# Patient Record
Sex: Female | Born: 1940 | Race: White | Hispanic: No | Smoking: Never smoker
Health system: Southern US, Community
[De-identification: ages and names within clinical notes are randomized; demographics above are authoritative.]

## PROBLEM LIST (undated history)

## (undated) DIAGNOSIS — E785 Hyperlipidemia, unspecified: Secondary | ICD-10-CM

## (undated) DIAGNOSIS — K649 Unspecified hemorrhoids: Secondary | ICD-10-CM

## (undated) DIAGNOSIS — I10 Essential (primary) hypertension: Secondary | ICD-10-CM

## (undated) HISTORY — DX: Essential (primary) hypertension: I10

## (undated) HISTORY — DX: Unspecified hemorrhoids: K64.9

## (undated) HISTORY — PX: TONSILLECTOMY AND ADENOIDECTOMY: SUR1326

## (undated) HISTORY — DX: Hyperlipidemia, unspecified: E78.5

## (undated) HISTORY — PX: ABDOMINAL HYSTERECTOMY: SHX81

---

## 2002-02-09 ENCOUNTER — Encounter: Payer: Self-pay | Admitting: Family Medicine

## 2002-02-09 ENCOUNTER — Ambulatory Visit (HOSPITAL_COMMUNITY): Admission: RE | Admit: 2002-02-09 | Discharge: 2002-02-09 | Payer: Self-pay | Admitting: Family Medicine

## 2003-02-16 ENCOUNTER — Ambulatory Visit (HOSPITAL_COMMUNITY): Admission: RE | Admit: 2003-02-16 | Discharge: 2003-02-16 | Payer: Self-pay | Admitting: Family Medicine

## 2003-02-16 ENCOUNTER — Encounter: Payer: Self-pay | Admitting: Family Medicine

## 2003-03-15 ENCOUNTER — Encounter: Payer: Self-pay | Admitting: Family Medicine

## 2003-03-15 ENCOUNTER — Ambulatory Visit (HOSPITAL_COMMUNITY): Admission: RE | Admit: 2003-03-15 | Discharge: 2003-03-15 | Payer: Self-pay | Admitting: Family Medicine

## 2004-04-10 ENCOUNTER — Ambulatory Visit (HOSPITAL_COMMUNITY): Admission: RE | Admit: 2004-04-10 | Discharge: 2004-04-10 | Payer: Self-pay | Admitting: Family Medicine

## 2005-04-17 ENCOUNTER — Ambulatory Visit (HOSPITAL_COMMUNITY): Admission: RE | Admit: 2005-04-17 | Discharge: 2005-04-17 | Payer: Self-pay | Admitting: Family Medicine

## 2005-05-13 ENCOUNTER — Ambulatory Visit (HOSPITAL_COMMUNITY): Admission: RE | Admit: 2005-05-13 | Discharge: 2005-05-13 | Payer: Self-pay | Admitting: Family Medicine

## 2006-04-19 ENCOUNTER — Ambulatory Visit (HOSPITAL_COMMUNITY): Admission: RE | Admit: 2006-04-19 | Discharge: 2006-04-19 | Payer: Self-pay | Admitting: Internal Medicine

## 2007-05-09 ENCOUNTER — Ambulatory Visit (HOSPITAL_COMMUNITY): Admission: RE | Admit: 2007-05-09 | Discharge: 2007-05-09 | Payer: Self-pay | Admitting: Family Medicine

## 2008-05-09 ENCOUNTER — Ambulatory Visit (HOSPITAL_COMMUNITY): Admission: RE | Admit: 2008-05-09 | Discharge: 2008-05-09 | Payer: Self-pay | Admitting: Family Medicine

## 2009-10-22 ENCOUNTER — Ambulatory Visit (HOSPITAL_COMMUNITY): Admission: RE | Admit: 2009-10-22 | Discharge: 2009-10-22 | Payer: Self-pay | Admitting: Family Medicine

## 2010-06-29 ENCOUNTER — Encounter: Payer: Self-pay | Admitting: Family Medicine

## 2010-11-24 ENCOUNTER — Other Ambulatory Visit (HOSPITAL_COMMUNITY): Payer: Self-pay | Admitting: Physician Assistant

## 2010-11-24 DIAGNOSIS — Z139 Encounter for screening, unspecified: Secondary | ICD-10-CM

## 2010-11-25 ENCOUNTER — Ambulatory Visit (HOSPITAL_COMMUNITY)
Admission: RE | Admit: 2010-11-25 | Discharge: 2010-11-25 | Disposition: A | Discharge status: Home / Self Care | Payer: Medicare Other | Source: Ambulatory Visit | Attending: Physician Assistant | Admitting: Physician Assistant

## 2010-11-25 DIAGNOSIS — Z139 Encounter for screening, unspecified: Secondary | ICD-10-CM

## 2010-11-25 DIAGNOSIS — Z1231 Encounter for screening mammogram for malignant neoplasm of breast: Secondary | ICD-10-CM | POA: Insufficient documentation

## 2012-03-04 DIAGNOSIS — Z23 Encounter for immunization: Secondary | ICD-10-CM | POA: Diagnosis not present

## 2012-03-07 ENCOUNTER — Other Ambulatory Visit (HOSPITAL_COMMUNITY): Payer: Self-pay | Admitting: Family Medicine

## 2012-03-07 DIAGNOSIS — Z139 Encounter for screening, unspecified: Secondary | ICD-10-CM

## 2012-03-08 ENCOUNTER — Ambulatory Visit (HOSPITAL_COMMUNITY)
Admission: RE | Admit: 2012-03-08 | Discharge: 2012-03-08 | Disposition: A | Discharge status: Home / Self Care | Payer: Medicare Other | Source: Ambulatory Visit | Attending: Family Medicine | Admitting: Family Medicine

## 2012-03-08 DIAGNOSIS — Z1231 Encounter for screening mammogram for malignant neoplasm of breast: Secondary | ICD-10-CM | POA: Diagnosis not present

## 2012-03-08 DIAGNOSIS — Z139 Encounter for screening, unspecified: Secondary | ICD-10-CM

## 2012-03-23 DIAGNOSIS — E785 Hyperlipidemia, unspecified: Secondary | ICD-10-CM | POA: Diagnosis not present

## 2012-03-23 DIAGNOSIS — Z7182 Exercise counseling: Secondary | ICD-10-CM | POA: Diagnosis not present

## 2012-03-23 DIAGNOSIS — Z23 Encounter for immunization: Secondary | ICD-10-CM | POA: Diagnosis not present

## 2012-03-23 DIAGNOSIS — R7301 Impaired fasting glucose: Secondary | ICD-10-CM | POA: Diagnosis not present

## 2012-03-23 DIAGNOSIS — I10 Essential (primary) hypertension: Secondary | ICD-10-CM | POA: Diagnosis not present

## 2012-03-23 DIAGNOSIS — Z713 Dietary counseling and surveillance: Secondary | ICD-10-CM | POA: Diagnosis not present

## 2012-03-23 DIAGNOSIS — Z6833 Body mass index (BMI) 33.0-33.9, adult: Secondary | ICD-10-CM | POA: Diagnosis not present

## 2013-03-27 ENCOUNTER — Other Ambulatory Visit (HOSPITAL_COMMUNITY): Payer: Self-pay | Admitting: Family Medicine

## 2013-03-27 DIAGNOSIS — Z139 Encounter for screening, unspecified: Secondary | ICD-10-CM

## 2013-03-30 ENCOUNTER — Ambulatory Visit (HOSPITAL_COMMUNITY)
Admission: RE | Admit: 2013-03-30 | Discharge: 2013-03-30 | Disposition: A | Discharge status: Home / Self Care | Payer: Medicare Other | Source: Ambulatory Visit | Attending: Family Medicine | Admitting: Family Medicine

## 2013-03-30 DIAGNOSIS — Z1231 Encounter for screening mammogram for malignant neoplasm of breast: Secondary | ICD-10-CM | POA: Insufficient documentation

## 2013-03-30 DIAGNOSIS — Z139 Encounter for screening, unspecified: Secondary | ICD-10-CM

## 2013-04-05 DIAGNOSIS — E785 Hyperlipidemia, unspecified: Secondary | ICD-10-CM | POA: Diagnosis not present

## 2013-04-05 DIAGNOSIS — I10 Essential (primary) hypertension: Secondary | ICD-10-CM | POA: Diagnosis not present

## 2013-04-05 DIAGNOSIS — Z6833 Body mass index (BMI) 33.0-33.9, adult: Secondary | ICD-10-CM | POA: Diagnosis not present

## 2013-04-05 DIAGNOSIS — R7301 Impaired fasting glucose: Secondary | ICD-10-CM | POA: Diagnosis not present

## 2013-04-06 DIAGNOSIS — Z23 Encounter for immunization: Secondary | ICD-10-CM | POA: Diagnosis not present

## 2013-11-27 ENCOUNTER — Encounter: Payer: Self-pay | Admitting: Gastroenterology

## 2013-11-27 DIAGNOSIS — Z6832 Body mass index (BMI) 32.0-32.9, adult: Secondary | ICD-10-CM | POA: Diagnosis not present

## 2013-11-27 DIAGNOSIS — K921 Melena: Secondary | ICD-10-CM | POA: Diagnosis not present

## 2013-12-01 NOTE — Telephone Encounter (Signed)
error 

## 2014-02-06 ENCOUNTER — Encounter: Payer: Self-pay | Admitting: Gastroenterology

## 2014-02-06 ENCOUNTER — Ambulatory Visit (INDEPENDENT_AMBULATORY_CARE_PROVIDER_SITE_OTHER): Payer: Medicare Other | Admitting: Gastroenterology

## 2014-02-06 VITALS — BP 146/90 | HR 80 | Ht 63.0 in | Wt 187.6 lb

## 2014-02-06 DIAGNOSIS — K921 Melena: Secondary | ICD-10-CM | POA: Diagnosis not present

## 2014-02-06 MED ORDER — PEG-KCL-NACL-NASULF-NA ASC-C 100 G PO SOLR: 1.0000 | Freq: Once | ORAL | Status: DC

## 2014-02-06 NOTE — Progress Notes (Signed)
    History of Present Illness: This is a 73 year old female accompanied by her daughter. The patient relates one day of watery diarrhea small amount of red blood that occurred in June. She saw her primary physician and was prescribed Anusol suppositories for presumed hemorrhoids. She states she has occasional mild constipation for many years and this has not changed. She has no other gastrointestinal complaints. She is not previously undergone colonoscopy despite recommendations by her primary physician Dr. Hilma Favors. Denies weight loss, abdominal pain, diarrhea, change in stool caliber, melena, nausea, vomiting, dysphagia, reflux symptoms, chest pain.  Review of Systems: Pertinent positive and negative review of systems were noted in the above HPI section. All other review of systems were otherwise negative.  Current Medications, Allergies, Past Medical History, Past Surgical History, Family History and Social History were reviewed in [employer] record.  Physical Exam: General: Well developed , well nourished, no acute distress Head: Normocephalic and atraumatic Eyes:  sclerae anicteric, EOMI Ears: Normal auditory acuity Mouth: No deformity or lesions Neck: Supple, no masses or thyromegaly Lungs: Clear throughout to auscultation Heart: Regular rate and rhythm; no murmurs, rubs or bruits Abdomen: Soft, non tender and non distended. No masses, hepatosplenomegaly or hernias noted. Normal Bowel sounds Rectal: deferred to colonoscopy   Musculoskeletal: Symmetrical with no gross deformities  Skin: No lesions on visible extremities Pulses:  Normal pulses noted Extremities: No clubbing, cyanosis, edema or deformities noted Neurological: Alert oriented x 4, grossly nonfocal Cervical Nodes:  No significant cervical adenopathy Inguinal Nodes: No significant inguinal adenopathy Psychological:  Alert and cooperative. Normal mood and affect  Assessment and Recommendations:  1.  Hematochezia, in setting of a self-limited diarrheal illness.  Rule out hemorrhoids, colorectal neoplasms. Schedule colonoscopy. The risks, benefits, and alternatives to colonoscopy with possible biopsy and possible polypectomy were discussed with the patient and they consent to proceed.

## 2014-02-06 NOTE — Patient Instructions (Signed)
You have been scheduled for a colonoscopy. Please follow written instructions given to you at your visit today.  Please pick up your prep kit at the pharmacy within the next 1-3 days. If you use inhalers (even only as needed), please bring them with you on the day of your procedure.  Thank you for choosing me and [facility].  Pricilla Riffle. Dagoberto Ligas., MD., Marval Regal  cc: Sharilyn Sites, MD

## 2014-02-19 ENCOUNTER — Ambulatory Visit (AMBULATORY_SURGERY_CENTER): Payer: Medicare Other | Admitting: Gastroenterology

## 2014-02-19 ENCOUNTER — Encounter: Payer: Self-pay | Admitting: Gastroenterology

## 2014-02-19 VITALS — BP 143/77 | HR 75 | Temp 98.5°F | Resp 29 | Ht 63.0 in | Wt 187.0 lb

## 2014-02-19 DIAGNOSIS — D126 Benign neoplasm of colon, unspecified: Secondary | ICD-10-CM

## 2014-02-19 DIAGNOSIS — E669 Obesity, unspecified: Secondary | ICD-10-CM | POA: Diagnosis not present

## 2014-02-19 DIAGNOSIS — K921 Melena: Secondary | ICD-10-CM | POA: Diagnosis not present

## 2014-02-19 DIAGNOSIS — D120 Benign neoplasm of cecum: Secondary | ICD-10-CM

## 2014-02-19 DIAGNOSIS — I10 Essential (primary) hypertension: Secondary | ICD-10-CM | POA: Diagnosis not present

## 2014-02-19 MED ORDER — SODIUM CHLORIDE 0.9 % IV SOLN: 500.0000 mL | INTRAVENOUS | Status: DC

## 2014-02-19 NOTE — Op Note (Signed)
[facility]  [address] [place], [address]   COLONOSCOPY PROCEDURE REPORT  PATIENT: Heather Hale, Heather Hale  MR#: [id number] BIRTHDATE: 1940/06/14 , 73  yrs. old GENDER: Female ENDOSCOPIST: Ladene Artist, MD, [facility] REFERRED GM:WNUU Hilma Favors, M.D. PROCEDURE DATE:  02/19/2014 PROCEDURE:   Colonoscopy with biopsy First Screening Colonoscopy - Avg.  risk and is 50 yrs.  old or older - No.  Prior Negative Screening - Now for repeat screening. N/A  History of Adenoma - Now for follow-up colonoscopy & has been > or = to 3 yrs.  N/A  Polyps Removed Today? Yes. ASA CLASS:   Class II INDICATIONS:hematochezia. MEDICATIONS: MAC sedation, administered by CRNA and propofol (Diprivan) 250mg  IV DESCRIPTION OF PROCEDURE:   After the risks benefits and alternatives of the procedure were thoroughly explained, informed consent was obtained.  A digital rectal exam revealed no abnormalities of the rectum.   The LB [id number]  endoscope was introduced through the anus and advanced to the cecum, which was identified by both the appendix and ileocecal valve. No adverse events experienced.   The quality of the prep was good, using MoviPrep  The instrument was then slowly withdrawn as the colon was fully examined.  COLON FINDINGS: A sessile polyp measuring 4 mm in size was found at the cecum.  A polypectomy was performed with cold forceps.  The resection was complete and the polyp tissue was completely retrieved.   The colon was otherwise normal.  There was no diverticulosis, inflammation, polyps or cancers unless previously stated.  Retroflexed views revealed moderate internal hemorrhoids. The time to cecum=8 minutes 38 seconds.  Withdrawal time=9 minutes 20 seconds.  The scope was withdrawn and the procedure completed.  COMPLICATIONS: There were no complications.  ENDOSCOPIC IMPRESSION: 1.   Sessile polyp measuring 4 mm at the cecum; polypectomy performed with cold forceps 2.    Moderate internal hemorrhoids  RECOMMENDATIONS: 1.  Await pathology results 2.  Given your age, you will not need another colonoscopy for colon cancer screening or polyp surveillance.  These types of tests usually stop around the age 68.  eSigned:  Ladene Artist, MD, [facility] 02/19/2014 8:36 AM

## 2014-02-19 NOTE — Progress Notes (Signed)
Called to room to assist during endoscopic procedure.  Patient ID and intended procedure confirmed with present staff. Received instructions for my participation in the procedure from the performing physician.  

## 2014-02-19 NOTE — Patient Instructions (Signed)
Discharge instructions given with verbal understanding. Handouts on polyps and diverticulosis. Resume previous medications. YOU HAD AN ENDOSCOPIC PROCEDURE TODAY AT THE [facility]: Refer to the procedure report that was given to you for any specific questions about what was found during the examination.  If the procedure report does not answer your questions, please call your gastroenterologist to clarify.  If you requested that your care partner not be given the details of your procedure findings, then the procedure report has been included in a sealed envelope for you to review at your convenience later.  YOU SHOULD EXPECT: Some feelings of bloating in the abdomen. Passage of more gas than usual.  Walking can help get rid of the air that was put into your GI tract during the procedure and reduce the bloating. If you had a lower endoscopy (such as a colonoscopy or flexible sigmoidoscopy) you may notice spotting of blood in your stool or on the toilet paper. If you underwent a bowel prep for your procedure, then you may not have a normal bowel movement for a few days.  DIET: Your first meal following the procedure should be a light meal and then it is ok to progress to your normal diet.  A half-sandwich or bowl of soup is an example of a good first meal.  Heavy or fried foods are harder to digest and may make you feel nauseous or bloated.  Likewise meals heavy in dairy and vegetables can cause extra gas to form and this can also increase the bloating.  Drink plenty of fluids but you should avoid alcoholic beverages for 24 hours.  ACTIVITY: Your care partner should take you home directly after the procedure.  You should plan to take it easy, moving slowly for the rest of the day.  You can resume normal activity the day after the procedure however you should NOT DRIVE or use heavy machinery for 24 hours (because of the sedation medicines used during the test).    SYMPTOMS TO REPORT  IMMEDIATELY: A gastroenterologist can be reached at any hour.  During normal business hours, 8:30 AM to 5:00 PM Monday through Friday, call [phone].  After hours and on weekends, please call the GI answering service at [phone] who will take a message and have the physician on call contact you.   Following lower endoscopy (colonoscopy or flexible sigmoidoscopy):  Excessive amounts of blood in the stool  Significant tenderness or worsening of abdominal pains  Swelling of the abdomen that is new, acute  Fever of 100F or higher  FOLLOW UP: If any biopsies were taken you will be contacted by phone or by letter within the next 1-3 weeks.  Call your gastroenterologist if you have not heard about the biopsies in 3 weeks.  Our staff will call the home number listed on your records the next business day following your procedure to check on you and address any questions or concerns that you may have at that time regarding the information given to you following your procedure. This is a courtesy call and so if there is no answer at the home number and we have not heard from you through the emergency physician on call, we will assume that you have returned to your regular daily activities without incident.  SIGNATURES/CONFIDENTIALITY: You and/or your care partner have signed paperwork which will be entered into your electronic medical record.  These signatures attest to the fact that that the information above on your After Visit Summary   has been reviewed and is understood.  Full responsibility of the confidentiality of this discharge information lies with you and/or your care-partner. 

## 2014-02-19 NOTE — Progress Notes (Signed)
Procedure ends, to recovery, report given and VSS. 

## 2014-02-20 ENCOUNTER — Telehealth: Payer: Self-pay | Admitting: *Deleted

## 2014-02-20 NOTE — Telephone Encounter (Signed)
  Follow up Call-  Call back number 02/19/2014  Post procedure Call Back phone  # [phone] or [phone]  Permission to leave phone message Yes     Patient questions:  Do you have a fever, pain , or abdominal swelling? No. Pain Score  0 *  Have you tolerated food without any problems? Yes.    Have you been able to return to your normal activities? Yes.    Do you have any questions about your discharge instructions: Diet   No. Medications  No. Follow up visit  No.  Do you have questions or concerns about your Care? No.  Actions: * If pain score is 4 or above: No action needed, pain <4.

## 2014-02-25 ENCOUNTER — Encounter: Payer: Self-pay | Admitting: Gastroenterology

## 2014-04-27 ENCOUNTER — Other Ambulatory Visit (HOSPITAL_COMMUNITY): Payer: Self-pay | Admitting: Family Medicine

## 2014-04-27 DIAGNOSIS — Z1231 Encounter for screening mammogram for malignant neoplasm of breast: Secondary | ICD-10-CM

## 2014-04-30 ENCOUNTER — Ambulatory Visit (HOSPITAL_COMMUNITY): Payer: Medicare Other

## 2014-05-07 ENCOUNTER — Ambulatory Visit (HOSPITAL_COMMUNITY)
Admission: RE | Admit: 2014-05-07 | Discharge: 2014-05-07 | Disposition: A | Discharge status: Home / Self Care | Payer: Medicare Other | Source: Ambulatory Visit | Attending: Family Medicine | Admitting: Family Medicine

## 2014-05-07 DIAGNOSIS — Z1231 Encounter for screening mammogram for malignant neoplasm of breast: Secondary | ICD-10-CM | POA: Diagnosis not present

## 2014-06-04 DIAGNOSIS — R7301 Impaired fasting glucose: Secondary | ICD-10-CM | POA: Diagnosis not present

## 2014-06-04 DIAGNOSIS — Z6833 Body mass index (BMI) 33.0-33.9, adult: Secondary | ICD-10-CM | POA: Diagnosis not present

## 2014-06-04 DIAGNOSIS — I10 Essential (primary) hypertension: Secondary | ICD-10-CM | POA: Diagnosis not present

## 2014-06-04 DIAGNOSIS — E6609 Other obesity due to excess calories: Secondary | ICD-10-CM | POA: Diagnosis not present

## 2014-06-04 DIAGNOSIS — E782 Mixed hyperlipidemia: Secondary | ICD-10-CM | POA: Diagnosis not present

## 2015-01-14 DIAGNOSIS — H31002 Unspecified chorioretinal scars, left eye: Secondary | ICD-10-CM | POA: Diagnosis not present

## 2015-01-14 DIAGNOSIS — H2510 Age-related nuclear cataract, unspecified eye: Secondary | ICD-10-CM | POA: Diagnosis not present

## 2015-03-05 DIAGNOSIS — J302 Other seasonal allergic rhinitis: Secondary | ICD-10-CM | POA: Diagnosis not present

## 2015-03-05 DIAGNOSIS — Z1389 Encounter for screening for other disorder: Secondary | ICD-10-CM | POA: Diagnosis not present

## 2015-03-05 DIAGNOSIS — Z23 Encounter for immunization: Secondary | ICD-10-CM | POA: Diagnosis not present

## 2015-03-05 DIAGNOSIS — R7309 Other abnormal glucose: Secondary | ICD-10-CM | POA: Diagnosis not present

## 2015-03-05 DIAGNOSIS — I10 Essential (primary) hypertension: Secondary | ICD-10-CM | POA: Diagnosis not present

## 2015-03-05 DIAGNOSIS — E6609 Other obesity due to excess calories: Secondary | ICD-10-CM | POA: Diagnosis not present

## 2015-03-05 DIAGNOSIS — E782 Mixed hyperlipidemia: Secondary | ICD-10-CM | POA: Diagnosis not present

## 2015-03-05 DIAGNOSIS — Z6832 Body mass index (BMI) 32.0-32.9, adult: Secondary | ICD-10-CM | POA: Diagnosis not present

## 2015-06-19 ENCOUNTER — Other Ambulatory Visit (HOSPITAL_COMMUNITY): Payer: Self-pay | Admitting: Family Medicine

## 2015-06-19 DIAGNOSIS — Z1231 Encounter for screening mammogram for malignant neoplasm of breast: Secondary | ICD-10-CM

## 2015-06-24 ENCOUNTER — Ambulatory Visit (HOSPITAL_COMMUNITY)
Admission: RE | Admit: 2015-06-24 | Discharge: 2015-06-24 | Disposition: A | Discharge status: Home / Self Care | Payer: Medicare Other | Source: Ambulatory Visit | Attending: Family Medicine | Admitting: Family Medicine

## 2015-06-24 DIAGNOSIS — Z1231 Encounter for screening mammogram for malignant neoplasm of breast: Secondary | ICD-10-CM | POA: Diagnosis not present

## 2015-09-02 DIAGNOSIS — I10 Essential (primary) hypertension: Secondary | ICD-10-CM | POA: Diagnosis not present

## 2015-09-02 DIAGNOSIS — R7309 Other abnormal glucose: Secondary | ICD-10-CM | POA: Diagnosis not present

## 2015-09-02 DIAGNOSIS — E782 Mixed hyperlipidemia: Secondary | ICD-10-CM | POA: Diagnosis not present

## 2015-09-02 DIAGNOSIS — Z1389 Encounter for screening for other disorder: Secondary | ICD-10-CM | POA: Diagnosis not present

## 2015-09-02 DIAGNOSIS — Z6832 Body mass index (BMI) 32.0-32.9, adult: Secondary | ICD-10-CM | POA: Diagnosis not present

## 2015-10-03 DIAGNOSIS — Z0000 Encounter for general adult medical examination without abnormal findings: Secondary | ICD-10-CM | POA: Diagnosis not present

## 2015-10-03 DIAGNOSIS — Z6833 Body mass index (BMI) 33.0-33.9, adult: Secondary | ICD-10-CM | POA: Diagnosis not present

## 2015-10-03 DIAGNOSIS — E6609 Other obesity due to excess calories: Secondary | ICD-10-CM | POA: Diagnosis not present

## 2015-10-03 DIAGNOSIS — Z1389 Encounter for screening for other disorder: Secondary | ICD-10-CM | POA: Diagnosis not present

## 2015-10-03 DIAGNOSIS — I10 Essential (primary) hypertension: Secondary | ICD-10-CM | POA: Diagnosis not present

## 2015-12-03 DIAGNOSIS — Z6833 Body mass index (BMI) 33.0-33.9, adult: Secondary | ICD-10-CM | POA: Diagnosis not present

## 2015-12-03 DIAGNOSIS — S83411A Sprain of medial collateral ligament of right knee, initial encounter: Secondary | ICD-10-CM | POA: Diagnosis not present

## 2015-12-03 DIAGNOSIS — Z1389 Encounter for screening for other disorder: Secondary | ICD-10-CM | POA: Diagnosis not present

## 2015-12-03 DIAGNOSIS — M25561 Pain in right knee: Secondary | ICD-10-CM | POA: Diagnosis not present

## 2016-04-24 DIAGNOSIS — Z23 Encounter for immunization: Secondary | ICD-10-CM | POA: Diagnosis not present

## 2016-04-24 DIAGNOSIS — I10 Essential (primary) hypertension: Secondary | ICD-10-CM | POA: Diagnosis not present

## 2016-04-24 DIAGNOSIS — R7309 Other abnormal glucose: Secondary | ICD-10-CM | POA: Diagnosis not present

## 2016-04-24 DIAGNOSIS — Z1389 Encounter for screening for other disorder: Secondary | ICD-10-CM | POA: Diagnosis not present

## 2016-04-24 DIAGNOSIS — E782 Mixed hyperlipidemia: Secondary | ICD-10-CM | POA: Diagnosis not present

## 2016-04-24 DIAGNOSIS — Z6833 Body mass index (BMI) 33.0-33.9, adult: Secondary | ICD-10-CM | POA: Diagnosis not present

## 2016-06-30 ENCOUNTER — Other Ambulatory Visit (HOSPITAL_COMMUNITY): Payer: Self-pay | Admitting: Family Medicine

## 2016-06-30 DIAGNOSIS — Z1231 Encounter for screening mammogram for malignant neoplasm of breast: Secondary | ICD-10-CM

## 2016-07-06 ENCOUNTER — Ambulatory Visit (HOSPITAL_COMMUNITY)
Admission: RE | Admit: 2016-07-06 | Discharge: 2016-07-06 | Disposition: A | Discharge status: Home / Self Care | Payer: Medicare Other | Source: Ambulatory Visit | Attending: Family Medicine | Admitting: Family Medicine

## 2016-07-06 DIAGNOSIS — Z1231 Encounter for screening mammogram for malignant neoplasm of breast: Secondary | ICD-10-CM | POA: Insufficient documentation

## 2016-12-08 DIAGNOSIS — Z0000 Encounter for general adult medical examination without abnormal findings: Secondary | ICD-10-CM | POA: Diagnosis not present

## 2016-12-08 DIAGNOSIS — Z681 Body mass index (BMI) 19 or less, adult: Secondary | ICD-10-CM | POA: Diagnosis not present

## 2017-01-27 DIAGNOSIS — Z6834 Body mass index (BMI) 34.0-34.9, adult: Secondary | ICD-10-CM | POA: Diagnosis not present

## 2017-01-27 DIAGNOSIS — R7301 Impaired fasting glucose: Secondary | ICD-10-CM | POA: Diagnosis not present

## 2017-01-27 DIAGNOSIS — E782 Mixed hyperlipidemia: Secondary | ICD-10-CM | POA: Diagnosis not present

## 2017-01-27 DIAGNOSIS — I10 Essential (primary) hypertension: Secondary | ICD-10-CM | POA: Diagnosis not present

## 2017-01-27 DIAGNOSIS — E6609 Other obesity due to excess calories: Secondary | ICD-10-CM | POA: Diagnosis not present

## 2017-01-27 DIAGNOSIS — R7309 Other abnormal glucose: Secondary | ICD-10-CM | POA: Diagnosis not present

## 2017-01-27 DIAGNOSIS — Z1389 Encounter for screening for other disorder: Secondary | ICD-10-CM | POA: Diagnosis not present

## 2017-03-24 DIAGNOSIS — Z23 Encounter for immunization: Secondary | ICD-10-CM | POA: Diagnosis not present

## 2017-04-09 DIAGNOSIS — Z1389 Encounter for screening for other disorder: Secondary | ICD-10-CM | POA: Diagnosis not present

## 2017-04-09 DIAGNOSIS — E669 Obesity, unspecified: Secondary | ICD-10-CM | POA: Diagnosis not present

## 2017-04-09 DIAGNOSIS — I10 Essential (primary) hypertension: Secondary | ICD-10-CM | POA: Diagnosis not present

## 2017-04-09 DIAGNOSIS — R1110 Vomiting, unspecified: Secondary | ICD-10-CM | POA: Diagnosis not present

## 2017-04-09 DIAGNOSIS — R509 Fever, unspecified: Secondary | ICD-10-CM | POA: Diagnosis not present

## 2017-04-09 DIAGNOSIS — N12 Tubulo-interstitial nephritis, not specified as acute or chronic: Secondary | ICD-10-CM | POA: Diagnosis not present

## 2017-04-09 DIAGNOSIS — Z6834 Body mass index (BMI) 34.0-34.9, adult: Secondary | ICD-10-CM | POA: Diagnosis not present

## 2017-04-12 DIAGNOSIS — Z6834 Body mass index (BMI) 34.0-34.9, adult: Secondary | ICD-10-CM | POA: Diagnosis not present

## 2017-04-12 DIAGNOSIS — E6609 Other obesity due to excess calories: Secondary | ICD-10-CM | POA: Diagnosis not present

## 2017-04-12 DIAGNOSIS — N342 Other urethritis: Secondary | ICD-10-CM | POA: Diagnosis not present

## 2017-04-23 DIAGNOSIS — R350 Frequency of micturition: Secondary | ICD-10-CM | POA: Diagnosis not present

## 2017-04-23 DIAGNOSIS — E6609 Other obesity due to excess calories: Secondary | ICD-10-CM | POA: Diagnosis not present

## 2017-04-23 DIAGNOSIS — Z6833 Body mass index (BMI) 33.0-33.9, adult: Secondary | ICD-10-CM | POA: Diagnosis not present

## 2017-04-23 DIAGNOSIS — N390 Urinary tract infection, site not specified: Secondary | ICD-10-CM | POA: Diagnosis not present

## 2017-05-03 DIAGNOSIS — E6609 Other obesity due to excess calories: Secondary | ICD-10-CM | POA: Diagnosis not present

## 2017-05-03 DIAGNOSIS — N342 Other urethritis: Secondary | ICD-10-CM | POA: Diagnosis not present

## 2017-05-03 DIAGNOSIS — Z6834 Body mass index (BMI) 34.0-34.9, adult: Secondary | ICD-10-CM | POA: Diagnosis not present

## 2017-05-07 DIAGNOSIS — E6609 Other obesity due to excess calories: Secondary | ICD-10-CM | POA: Diagnosis not present

## 2017-05-07 DIAGNOSIS — Z6834 Body mass index (BMI) 34.0-34.9, adult: Secondary | ICD-10-CM | POA: Diagnosis not present

## 2017-05-07 DIAGNOSIS — N342 Other urethritis: Secondary | ICD-10-CM | POA: Diagnosis not present

## 2017-05-07 DIAGNOSIS — R39198 Other difficulties with micturition: Secondary | ICD-10-CM | POA: Diagnosis not present

## 2017-05-13 DIAGNOSIS — N3020 Other chronic cystitis without hematuria: Secondary | ICD-10-CM | POA: Diagnosis not present

## 2017-05-26 DIAGNOSIS — R194 Change in bowel habit: Secondary | ICD-10-CM | POA: Diagnosis not present

## 2017-05-26 DIAGNOSIS — R509 Fever, unspecified: Secondary | ICD-10-CM | POA: Diagnosis not present

## 2017-05-26 DIAGNOSIS — E6609 Other obesity due to excess calories: Secondary | ICD-10-CM | POA: Diagnosis not present

## 2017-05-26 DIAGNOSIS — N390 Urinary tract infection, site not specified: Secondary | ICD-10-CM | POA: Diagnosis not present

## 2017-05-26 DIAGNOSIS — Z6834 Body mass index (BMI) 34.0-34.9, adult: Secondary | ICD-10-CM | POA: Diagnosis not present

## 2017-05-27 DIAGNOSIS — Z6834 Body mass index (BMI) 34.0-34.9, adult: Secondary | ICD-10-CM | POA: Diagnosis not present

## 2017-05-27 DIAGNOSIS — N390 Urinary tract infection, site not specified: Secondary | ICD-10-CM | POA: Diagnosis not present

## 2017-05-27 DIAGNOSIS — R509 Fever, unspecified: Secondary | ICD-10-CM | POA: Diagnosis not present

## 2017-05-27 DIAGNOSIS — R194 Change in bowel habit: Secondary | ICD-10-CM | POA: Diagnosis not present

## 2017-06-03 DIAGNOSIS — E559 Vitamin D deficiency, unspecified: Secondary | ICD-10-CM | POA: Diagnosis not present

## 2017-06-03 DIAGNOSIS — E782 Mixed hyperlipidemia: Secondary | ICD-10-CM | POA: Diagnosis not present

## 2017-06-03 DIAGNOSIS — R7309 Other abnormal glucose: Secondary | ICD-10-CM | POA: Diagnosis not present

## 2017-06-03 DIAGNOSIS — E6609 Other obesity due to excess calories: Secondary | ICD-10-CM | POA: Diagnosis not present

## 2017-06-03 DIAGNOSIS — Z6833 Body mass index (BMI) 33.0-33.9, adult: Secondary | ICD-10-CM | POA: Diagnosis not present

## 2017-06-03 DIAGNOSIS — I10 Essential (primary) hypertension: Secondary | ICD-10-CM | POA: Diagnosis not present

## 2017-06-11 ENCOUNTER — Other Ambulatory Visit (HOSPITAL_COMMUNITY): Payer: Self-pay | Admitting: Internal Medicine

## 2017-06-11 ENCOUNTER — Ambulatory Visit (HOSPITAL_COMMUNITY)
Admission: RE | Admit: 2017-06-11 | Discharge: 2017-06-11 | Disposition: A | Discharge status: Home / Self Care | Payer: Medicare Other | Source: Ambulatory Visit | Attending: Internal Medicine | Admitting: Internal Medicine

## 2017-06-11 DIAGNOSIS — I10 Essential (primary) hypertension: Secondary | ICD-10-CM | POA: Diagnosis not present

## 2017-06-11 DIAGNOSIS — Z1389 Encounter for screening for other disorder: Secondary | ICD-10-CM | POA: Diagnosis not present

## 2017-06-11 DIAGNOSIS — E669 Obesity, unspecified: Secondary | ICD-10-CM | POA: Diagnosis not present

## 2017-06-11 DIAGNOSIS — R071 Chest pain on breathing: Secondary | ICD-10-CM

## 2017-06-11 DIAGNOSIS — J986 Disorders of diaphragm: Secondary | ICD-10-CM | POA: Diagnosis not present

## 2017-06-11 DIAGNOSIS — E6609 Other obesity due to excess calories: Secondary | ICD-10-CM | POA: Diagnosis not present

## 2017-06-11 DIAGNOSIS — R509 Fever, unspecified: Secondary | ICD-10-CM | POA: Diagnosis not present

## 2017-06-11 DIAGNOSIS — J329 Chronic sinusitis, unspecified: Secondary | ICD-10-CM | POA: Diagnosis not present

## 2017-06-11 DIAGNOSIS — R079 Chest pain, unspecified: Secondary | ICD-10-CM | POA: Diagnosis not present

## 2017-06-11 DIAGNOSIS — E782 Mixed hyperlipidemia: Secondary | ICD-10-CM | POA: Diagnosis not present

## 2017-06-11 DIAGNOSIS — Z6833 Body mass index (BMI) 33.0-33.9, adult: Secondary | ICD-10-CM | POA: Diagnosis not present

## 2017-06-11 DIAGNOSIS — N390 Urinary tract infection, site not specified: Secondary | ICD-10-CM | POA: Diagnosis not present

## 2017-06-22 DIAGNOSIS — R0789 Other chest pain: Secondary | ICD-10-CM | POA: Diagnosis not present

## 2017-06-22 DIAGNOSIS — E669 Obesity, unspecified: Secondary | ICD-10-CM | POA: Diagnosis not present

## 2017-06-22 DIAGNOSIS — Z6833 Body mass index (BMI) 33.0-33.9, adult: Secondary | ICD-10-CM | POA: Diagnosis not present

## 2017-06-22 DIAGNOSIS — R091 Pleurisy: Secondary | ICD-10-CM | POA: Diagnosis not present

## 2017-06-22 DIAGNOSIS — E6609 Other obesity due to excess calories: Secondary | ICD-10-CM | POA: Diagnosis not present

## 2017-06-22 DIAGNOSIS — I10 Essential (primary) hypertension: Secondary | ICD-10-CM | POA: Diagnosis not present

## 2017-07-01 DIAGNOSIS — N390 Urinary tract infection, site not specified: Secondary | ICD-10-CM | POA: Diagnosis not present

## 2017-07-05 ENCOUNTER — Other Ambulatory Visit (HOSPITAL_COMMUNITY): Payer: Self-pay | Admitting: Family Medicine

## 2017-07-05 DIAGNOSIS — Z1231 Encounter for screening mammogram for malignant neoplasm of breast: Secondary | ICD-10-CM

## 2017-07-07 ENCOUNTER — Other Ambulatory Visit (HOSPITAL_COMMUNITY): Payer: Self-pay | Admitting: Family Medicine

## 2017-07-07 ENCOUNTER — Ambulatory Visit (HOSPITAL_COMMUNITY)
Admission: RE | Admit: 2017-07-07 | Discharge: 2017-07-07 | Disposition: A | Discharge status: Home / Self Care | Payer: Medicare Other | Source: Ambulatory Visit | Attending: Family Medicine | Admitting: Family Medicine

## 2017-07-07 DIAGNOSIS — Z1231 Encounter for screening mammogram for malignant neoplasm of breast: Secondary | ICD-10-CM | POA: Diagnosis not present

## 2017-07-07 DIAGNOSIS — R928 Other abnormal and inconclusive findings on diagnostic imaging of breast: Secondary | ICD-10-CM

## 2017-07-20 ENCOUNTER — Ambulatory Visit (HOSPITAL_COMMUNITY)
Admission: RE | Admit: 2017-07-20 | Discharge: 2017-07-20 | Disposition: A | Discharge status: Home / Self Care | Payer: Medicare Other | Source: Ambulatory Visit | Attending: Family Medicine | Admitting: Family Medicine

## 2017-07-20 DIAGNOSIS — R928 Other abnormal and inconclusive findings on diagnostic imaging of breast: Secondary | ICD-10-CM | POA: Insufficient documentation

## 2017-08-17 DIAGNOSIS — J310 Chronic rhinitis: Secondary | ICD-10-CM | POA: Diagnosis not present

## 2017-08-17 DIAGNOSIS — J302 Other seasonal allergic rhinitis: Secondary | ICD-10-CM | POA: Diagnosis not present

## 2017-08-17 DIAGNOSIS — Z6834 Body mass index (BMI) 34.0-34.9, adult: Secondary | ICD-10-CM | POA: Diagnosis not present

## 2017-08-17 DIAGNOSIS — E6609 Other obesity due to excess calories: Secondary | ICD-10-CM | POA: Diagnosis not present

## 2017-12-13 DIAGNOSIS — Z6834 Body mass index (BMI) 34.0-34.9, adult: Secondary | ICD-10-CM | POA: Diagnosis not present

## 2017-12-13 DIAGNOSIS — E6609 Other obesity due to excess calories: Secondary | ICD-10-CM | POA: Diagnosis not present

## 2017-12-13 DIAGNOSIS — Z1389 Encounter for screening for other disorder: Secondary | ICD-10-CM | POA: Diagnosis not present

## 2017-12-13 DIAGNOSIS — Z0001 Encounter for general adult medical examination with abnormal findings: Secondary | ICD-10-CM | POA: Diagnosis not present

## 2018-03-31 DIAGNOSIS — Z23 Encounter for immunization: Secondary | ICD-10-CM | POA: Diagnosis not present

## 2018-04-12 DIAGNOSIS — I10 Essential (primary) hypertension: Secondary | ICD-10-CM | POA: Diagnosis not present

## 2018-04-12 DIAGNOSIS — Z1389 Encounter for screening for other disorder: Secondary | ICD-10-CM | POA: Diagnosis not present

## 2018-04-12 DIAGNOSIS — R7309 Other abnormal glucose: Secondary | ICD-10-CM | POA: Diagnosis not present

## 2018-04-12 DIAGNOSIS — E6609 Other obesity due to excess calories: Secondary | ICD-10-CM | POA: Diagnosis not present

## 2018-04-12 DIAGNOSIS — E782 Mixed hyperlipidemia: Secondary | ICD-10-CM | POA: Diagnosis not present

## 2018-04-12 DIAGNOSIS — Z6834 Body mass index (BMI) 34.0-34.9, adult: Secondary | ICD-10-CM | POA: Diagnosis not present

## 2018-11-28 DIAGNOSIS — H31002 Unspecified chorioretinal scars, left eye: Secondary | ICD-10-CM | POA: Diagnosis not present

## 2019-01-25 DIAGNOSIS — Z1389 Encounter for screening for other disorder: Secondary | ICD-10-CM | POA: Diagnosis not present

## 2019-01-25 DIAGNOSIS — E6609 Other obesity due to excess calories: Secondary | ICD-10-CM | POA: Diagnosis not present

## 2019-01-25 DIAGNOSIS — Z0001 Encounter for general adult medical examination with abnormal findings: Secondary | ICD-10-CM | POA: Diagnosis not present

## 2019-01-25 DIAGNOSIS — Z6834 Body mass index (BMI) 34.0-34.9, adult: Secondary | ICD-10-CM | POA: Diagnosis not present

## 2019-01-25 DIAGNOSIS — R7309 Other abnormal glucose: Secondary | ICD-10-CM | POA: Diagnosis not present

## 2019-01-25 DIAGNOSIS — J302 Other seasonal allergic rhinitis: Secondary | ICD-10-CM | POA: Diagnosis not present

## 2019-01-25 DIAGNOSIS — I10 Essential (primary) hypertension: Secondary | ICD-10-CM | POA: Diagnosis not present

## 2019-03-14 DIAGNOSIS — Z23 Encounter for immunization: Secondary | ICD-10-CM | POA: Diagnosis not present

## 2019-03-20 DIAGNOSIS — L918 Other hypertrophic disorders of the skin: Secondary | ICD-10-CM | POA: Diagnosis not present

## 2019-03-20 DIAGNOSIS — X32XXXA Exposure to sunlight, initial encounter: Secondary | ICD-10-CM | POA: Diagnosis not present

## 2019-03-20 DIAGNOSIS — L570 Actinic keratosis: Secondary | ICD-10-CM | POA: Diagnosis not present

## 2019-04-25 DIAGNOSIS — E6609 Other obesity due to excess calories: Secondary | ICD-10-CM | POA: Diagnosis not present

## 2019-04-25 DIAGNOSIS — Z0001 Encounter for general adult medical examination with abnormal findings: Secondary | ICD-10-CM | POA: Diagnosis not present

## 2019-04-25 DIAGNOSIS — Z1389 Encounter for screening for other disorder: Secondary | ICD-10-CM | POA: Diagnosis not present

## 2019-04-25 DIAGNOSIS — Z6834 Body mass index (BMI) 34.0-34.9, adult: Secondary | ICD-10-CM | POA: Diagnosis not present

## 2019-05-08 DIAGNOSIS — E7849 Other hyperlipidemia: Secondary | ICD-10-CM | POA: Diagnosis not present

## 2019-05-08 DIAGNOSIS — I10 Essential (primary) hypertension: Secondary | ICD-10-CM | POA: Diagnosis not present

## 2019-08-06 DIAGNOSIS — E7849 Other hyperlipidemia: Secondary | ICD-10-CM | POA: Diagnosis not present

## 2019-08-06 DIAGNOSIS — I10 Essential (primary) hypertension: Secondary | ICD-10-CM | POA: Diagnosis not present

## 2019-11-03 IMAGING — DX DG CHEST 2V
2 series · 2 of 2 positions shown · non-contrast
Comparison: None.

CLINICAL DATA: Chest pain while taking deeps breaths for a couple
of days. Patient states that pain is not sharp and is across
superior chest above breasts. Hx of HTN-controlled with medication.

EXAM:
CHEST  2 VIEW

[chest pa]
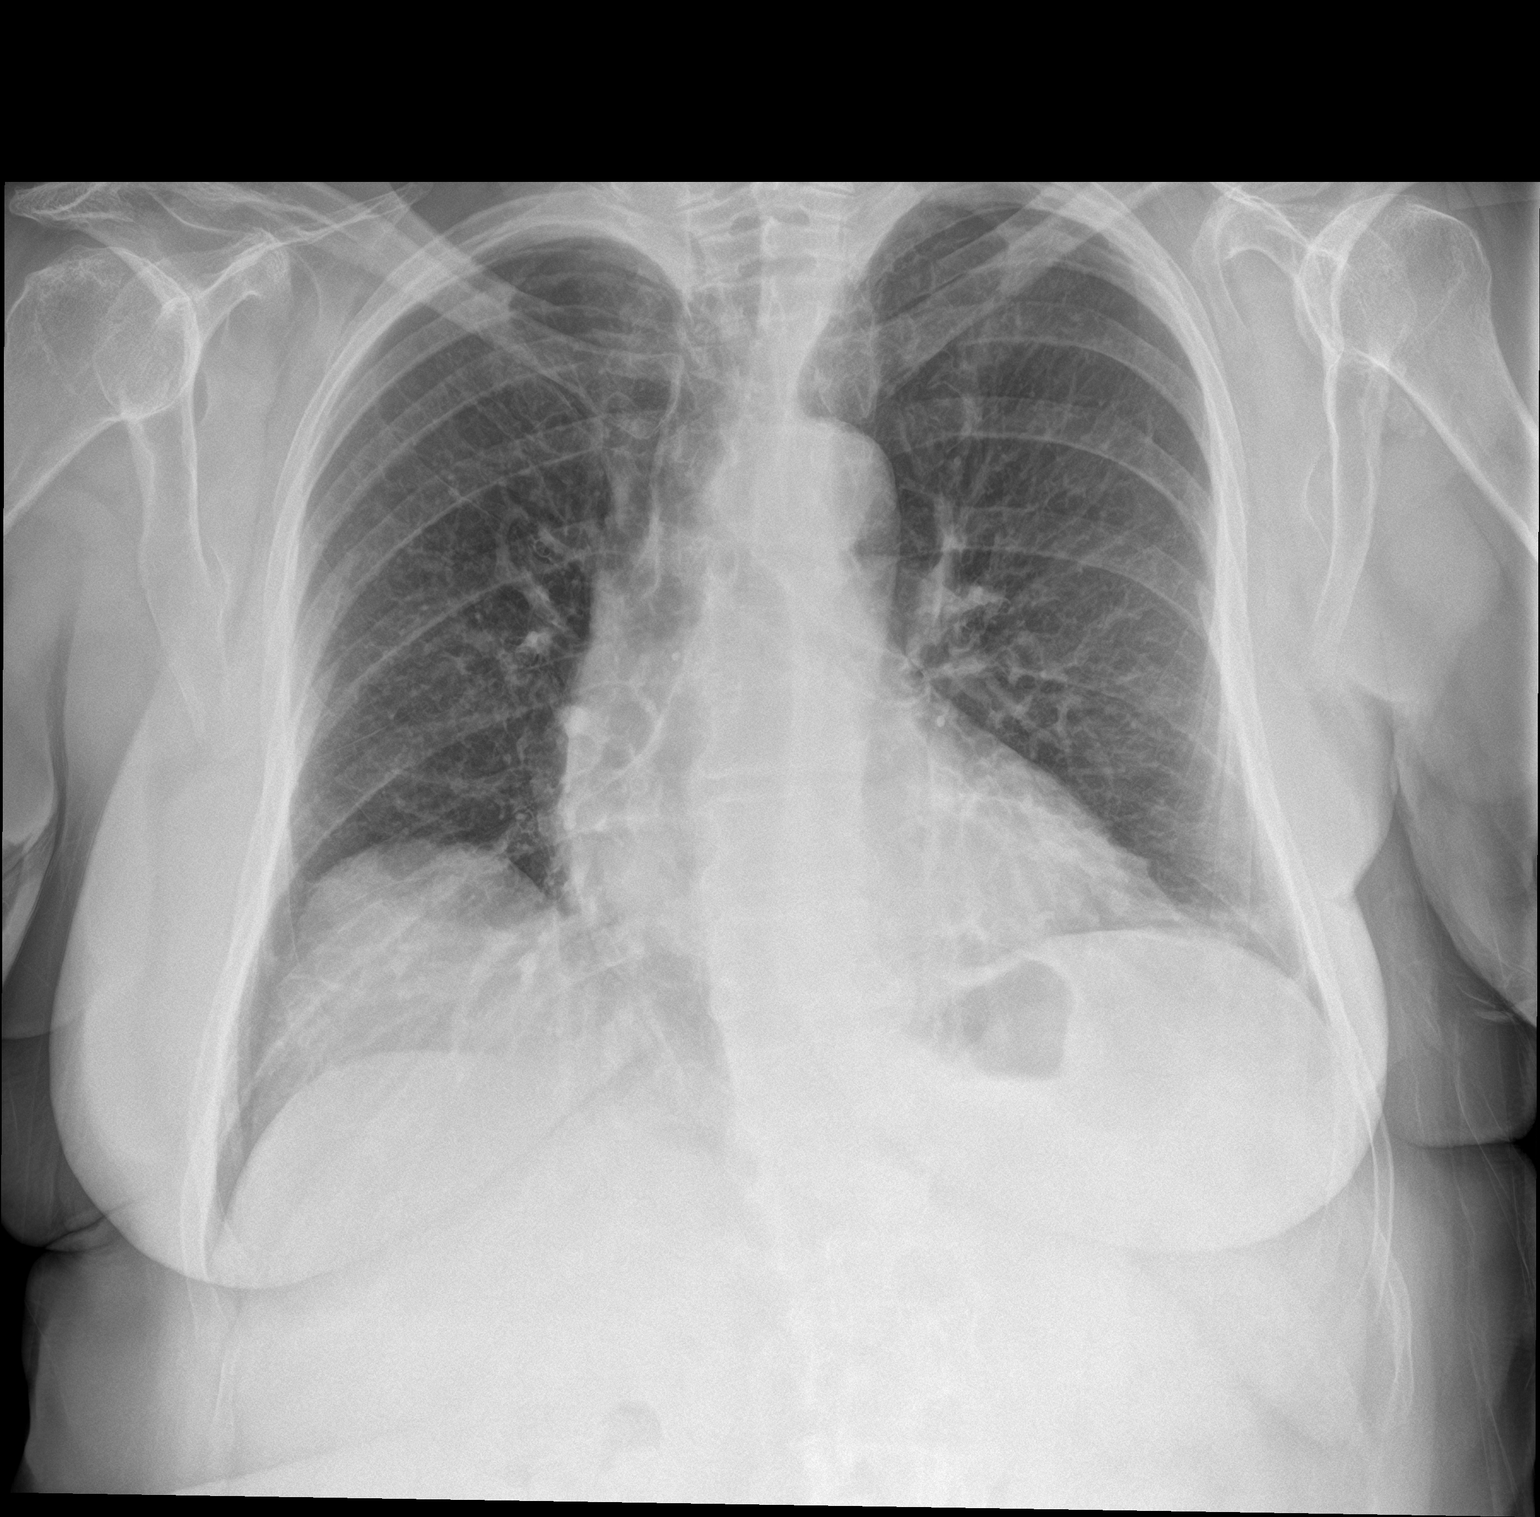

[chest lat]
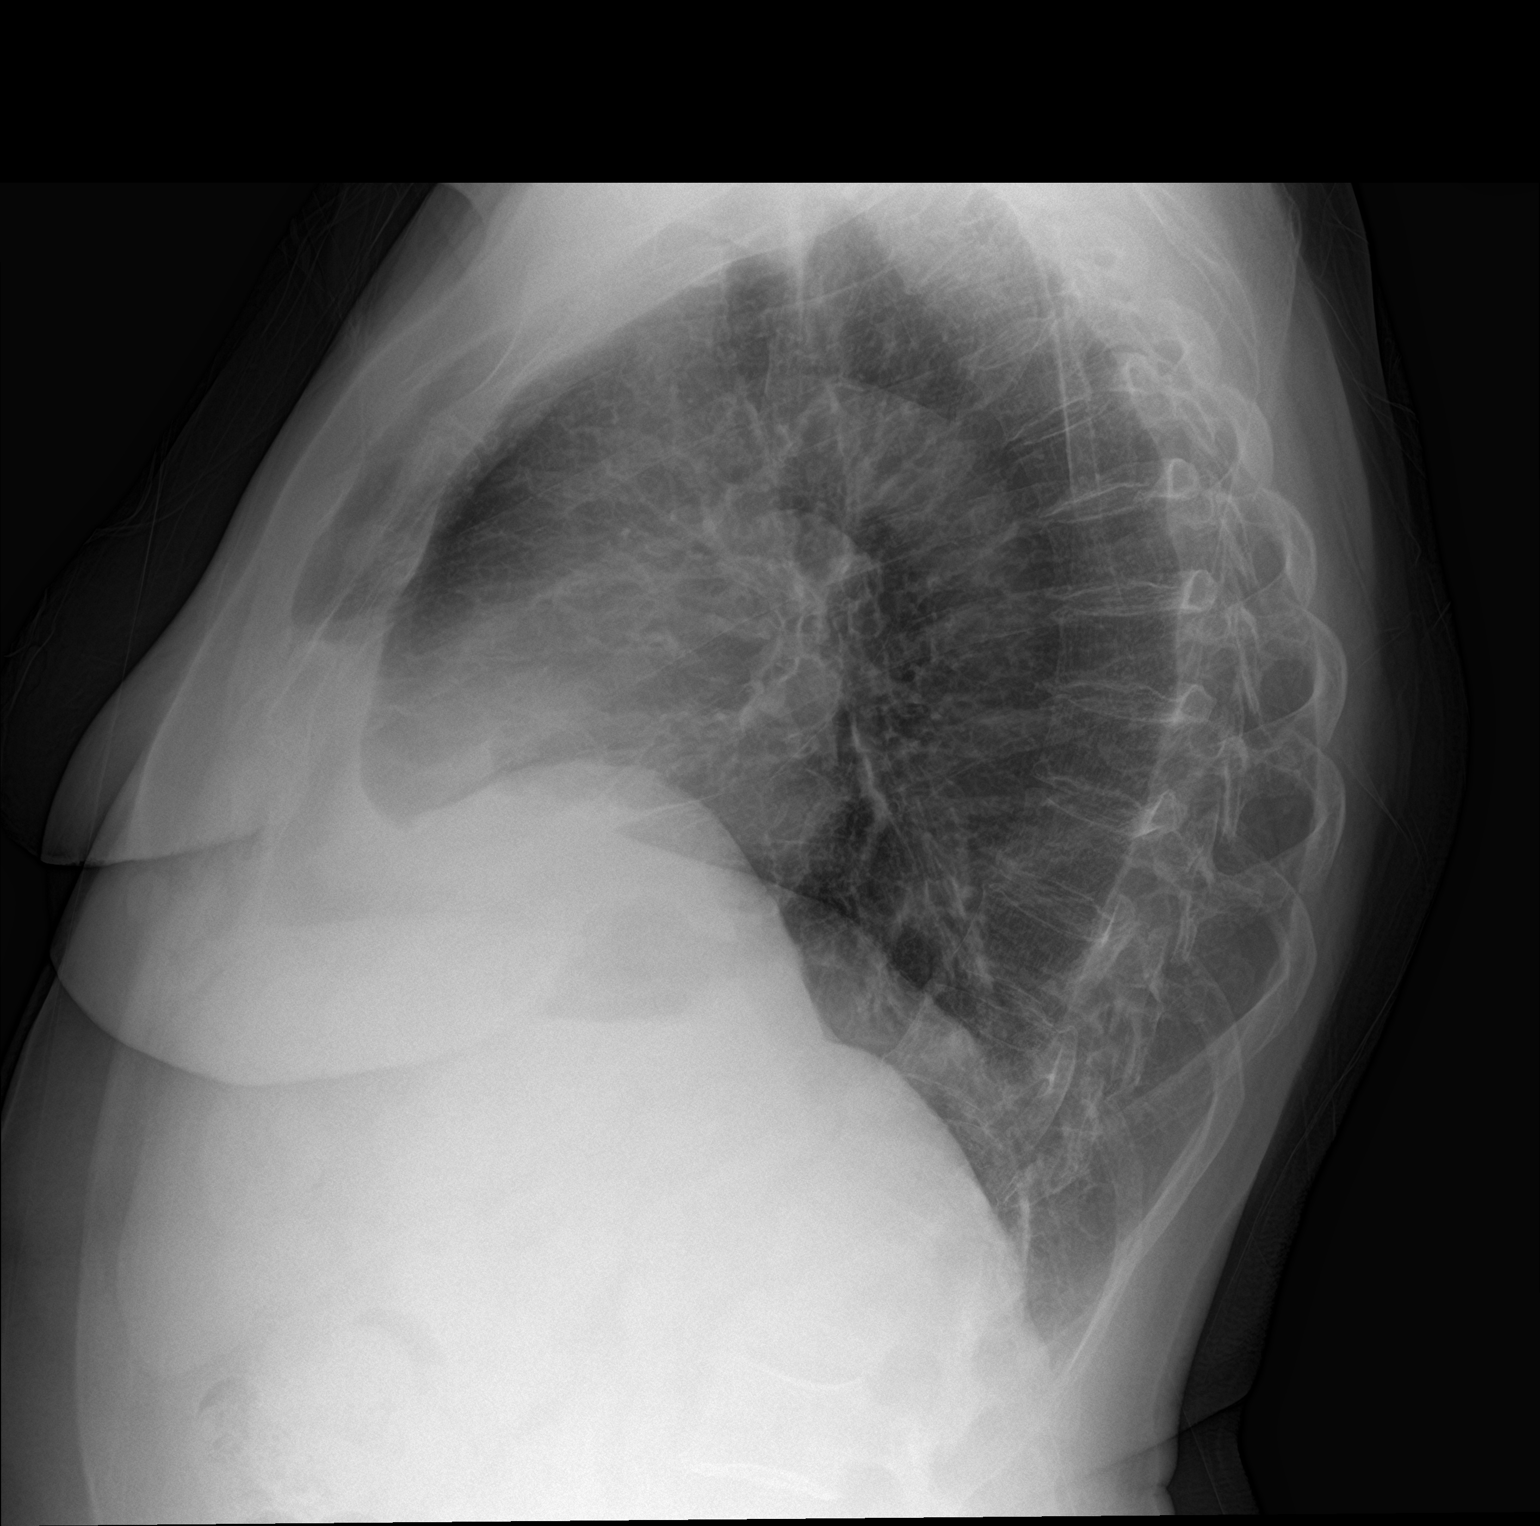

[2 of 2 positions shown; findings below may reference images not displayed]

FINDINGS: Heart size and mediastinal contours are within normal limits. Lungs
are clear. No pleural effusion or pneumothorax seen.

Elevation/eventration of the right hemidiaphragm, of uncertain
chronicity. Osseous and soft tissue structures about the chest are
otherwise unremarkable.
IMPRESSION: No active cardiopulmonary disease. No evidence of pneumonia or
pulmonary edema.

Slight elevation/eventration of the right hemidiaphragm, of
uncertain chronicity.

## 2019-11-06 DIAGNOSIS — I10 Essential (primary) hypertension: Secondary | ICD-10-CM | POA: Diagnosis not present

## 2019-11-06 DIAGNOSIS — E7849 Other hyperlipidemia: Secondary | ICD-10-CM | POA: Diagnosis not present

## 2019-11-30 DIAGNOSIS — H43813 Vitreous degeneration, bilateral: Secondary | ICD-10-CM | POA: Diagnosis not present

## 2020-03-05 DIAGNOSIS — Z1389 Encounter for screening for other disorder: Secondary | ICD-10-CM | POA: Diagnosis not present

## 2020-03-05 DIAGNOSIS — R7309 Other abnormal glucose: Secondary | ICD-10-CM | POA: Diagnosis not present

## 2020-03-05 DIAGNOSIS — Z1331 Encounter for screening for depression: Secondary | ICD-10-CM | POA: Diagnosis not present

## 2020-03-05 DIAGNOSIS — Z0001 Encounter for general adult medical examination with abnormal findings: Secondary | ICD-10-CM | POA: Diagnosis not present

## 2020-03-05 DIAGNOSIS — Z23 Encounter for immunization: Secondary | ICD-10-CM | POA: Diagnosis not present

## 2020-03-05 DIAGNOSIS — I10 Essential (primary) hypertension: Secondary | ICD-10-CM | POA: Diagnosis not present

## 2020-03-05 DIAGNOSIS — Z6834 Body mass index (BMI) 34.0-34.9, adult: Secondary | ICD-10-CM | POA: Diagnosis not present

## 2020-03-05 DIAGNOSIS — E7849 Other hyperlipidemia: Secondary | ICD-10-CM | POA: Diagnosis not present

## 2020-03-07 DIAGNOSIS — I10 Essential (primary) hypertension: Secondary | ICD-10-CM | POA: Diagnosis not present

## 2020-03-07 DIAGNOSIS — E7849 Other hyperlipidemia: Secondary | ICD-10-CM | POA: Diagnosis not present

## 2020-05-07 DIAGNOSIS — I10 Essential (primary) hypertension: Secondary | ICD-10-CM | POA: Diagnosis not present

## 2020-05-07 DIAGNOSIS — E7849 Other hyperlipidemia: Secondary | ICD-10-CM | POA: Diagnosis not present

## 2020-06-07 DIAGNOSIS — I10 Essential (primary) hypertension: Secondary | ICD-10-CM | POA: Diagnosis not present

## 2020-06-07 DIAGNOSIS — E7849 Other hyperlipidemia: Secondary | ICD-10-CM | POA: Diagnosis not present

## 2020-07-06 DIAGNOSIS — E7849 Other hyperlipidemia: Secondary | ICD-10-CM | POA: Diagnosis not present

## 2020-07-06 DIAGNOSIS — I10 Essential (primary) hypertension: Secondary | ICD-10-CM | POA: Diagnosis not present

## 2020-08-05 DIAGNOSIS — E7849 Other hyperlipidemia: Secondary | ICD-10-CM | POA: Diagnosis not present

## 2020-08-05 DIAGNOSIS — I10 Essential (primary) hypertension: Secondary | ICD-10-CM | POA: Diagnosis not present

## 2020-09-04 DIAGNOSIS — I10 Essential (primary) hypertension: Secondary | ICD-10-CM | POA: Diagnosis not present

## 2020-09-04 DIAGNOSIS — E7849 Other hyperlipidemia: Secondary | ICD-10-CM | POA: Diagnosis not present

## 2020-09-23 DIAGNOSIS — J069 Acute upper respiratory infection, unspecified: Secondary | ICD-10-CM | POA: Diagnosis not present

## 2020-09-27 ENCOUNTER — Other Ambulatory Visit: Payer: Self-pay

## 2020-09-27 ENCOUNTER — Emergency Department (HOSPITAL_COMMUNITY)
Admission: EM | Admit: 2020-09-27 | Discharge: 2020-09-27 | Disposition: A | Discharge status: Home / Self Care | Payer: Medicare Other | Attending: Emergency Medicine | Admitting: Emergency Medicine

## 2020-09-27 ENCOUNTER — Encounter (HOSPITAL_COMMUNITY): Payer: Self-pay

## 2020-09-27 DIAGNOSIS — I10 Essential (primary) hypertension: Secondary | ICD-10-CM | POA: Insufficient documentation

## 2020-09-27 DIAGNOSIS — Z79899 Other long term (current) drug therapy: Secondary | ICD-10-CM | POA: Diagnosis not present

## 2020-09-27 DIAGNOSIS — H66002 Acute suppurative otitis media without spontaneous rupture of ear drum, left ear: Secondary | ICD-10-CM | POA: Insufficient documentation

## 2020-09-27 DIAGNOSIS — H9202 Otalgia, left ear: Secondary | ICD-10-CM | POA: Diagnosis present

## 2020-09-27 DIAGNOSIS — Z7982 Long term (current) use of aspirin: Secondary | ICD-10-CM | POA: Insufficient documentation

## 2020-09-27 MED ORDER — FLUTICASONE PROPIONATE 50 MCG/ACT NA SUSP: 1.0000 | Freq: Every day | NASAL | 2 refills | Status: DC

## 2020-09-27 MED ORDER — CEFDINIR 300 MG PO CAPS: 300.0000 mg | ORAL_CAPSULE | Freq: Two times a day (BID) | ORAL | 0 refills | Status: DC

## 2020-09-27 MED ORDER — AFRIN NASAL SPRAY 0.05 % NA SOLN: 1.0000 | Freq: Two times a day (BID) | NASAL | 0 refills | Status: AC

## 2020-09-27 NOTE — ED Provider Notes (Signed)
[facility] EMERGENCY DEPARTMENT Provider Note   CSN: [id number] Arrival date & time: 09/27/20  6269     History Chief Complaint  Patient presents with  . Otalgia    Heather Hale is a 80 y.o. female.  Pt presents to the ED today with left ear pain.  The pt did see her doctor on 4/18 for the same and was given a rx for doxycycline and ear drops.  Pt said it is not getting better.  Pt has had some low grade fevers.  No drainage.  Hearing normally.  Pt said she called her pcp and he told her to come to the ED for "x-rays."  Pt denies any sob or headache.  Pt denies any swelling to her face.          Past Medical History:  Diagnosis Date  . Hemorrhoids   . Hyperlipidemia   . Hypertension     There are no problems to display for this patient.   Past Surgical History:  Procedure Laterality Date  . ABDOMINAL HYSTERECTOMY    . TONSILLECTOMY AND ADENOIDECTOMY       OB History   No obstetric history on file.     Family History  Problem Relation Age of Onset  . Diabetes Mother   . Heart disease Mother   . Colon cancer Neg Hx     Social History   Tobacco Use  . Smoking status: Never Smoker  . Smokeless tobacco: Never Used  Substance Use Topics  . Alcohol use: No  . Drug use: No    Home Medications Prior to Admission medications   Medication Sig Start Date End Date Taking? Authorizing Provider  cefdinir (OMNICEF) 300 MG capsule Take 1 capsule (300 mg total) by mouth 2 (two) times daily. 09/27/20  Yes Isla Pence, MD  aspirin 81 MG chewable tablet Chew 81 mg by mouth daily.    [provider]  fluticasone (FLONASE) 50 MCG/ACT nasal spray Place 1 spray into both nostrils daily. 09/27/20  Yes Isla Pence, MD  losartan-hydrochlorothiazide (HYZAAR) 100-25 MG per tablet Take 1 tablet by mouth daily.    [provider]  Omega-3 Fatty Acids (FISH OIL CONCENTRATE PO) Take by mouth.    [provider]  oxymetazoline (AFRIN NASAL SPRAY)  0.05 % nasal spray Place 1 spray into both nostrils 2 (two) times daily. 09/27/20  Yes Isla Pence, MD  simvastatin (ZOCOR) 20 MG tablet Take 20 mg by mouth daily.    [provider]    Allergies    Demerol [meperidine] and Penicillins  Review of Systems   Review of Systems  HENT: Positive for ear pain.   All other systems reviewed and are negative.   Physical Exam Updated Vital Signs BP (!) 141/78 (BP Location: Left Arm)   Pulse (!) 110   Temp 97.9 F (36.6 C) (Oral)   Resp 18   Ht 5\' 3"  (1.6 m)   Wt 83.9 kg   SpO2 96%   BMI 32.77 kg/m   Physical Exam Vitals and nursing note reviewed.  Constitutional:      Appearance: Normal appearance.  HENT:     Head: Normocephalic and atraumatic.     Right Ear: External ear normal.     Left Ear: External ear normal. No mastoid tenderness. Tympanic membrane is bulging.     Nose: Nose normal.     Mouth/Throat:     Mouth: Mucous membranes are moist.     Pharynx: Oropharynx is  clear.  Eyes:     Extraocular Movements: Extraocular movements intact.     Conjunctiva/sclera: Conjunctivae normal.     Pupils: Pupils are equal, round, and reactive to light.  Cardiovascular:     Rate and Rhythm: Normal rate and regular rhythm.     Pulses: Normal pulses.     Heart sounds: Normal heart sounds.  Pulmonary:     Effort: Pulmonary effort is normal.     Breath sounds: Normal breath sounds.  Abdominal:     General: Abdomen is flat. Bowel sounds are normal.     Palpations: Abdomen is soft.  Musculoskeletal:        General: Normal range of motion.     Cervical back: Normal range of motion and neck supple.  Skin:    General: Skin is warm.     Capillary Refill: Capillary refill takes less than 2 seconds.  Neurological:     General: No focal deficit present.     Mental Status: She is alert and oriented to person, place, and time.  Psychiatric:        Mood and Affect: Mood normal.        Behavior: Behavior normal.     ED  Results / Procedures / Treatments   Labs (all labs ordered are listed, but only abnormal results are displayed) Labs Reviewed - No data to display  EKG None  Radiology No results found.  Procedures Procedures   Medications Ordered in ED Medications - No data to display  ED Course  I have reviewed the triage vital signs and the nursing notes.  Pertinent labs & imaging results that were available during my care of the patient were reviewed by me and considered in my medical decision making (see chart for details).    MDM Rules/Calculators/A&P                          Pt has no evidence of mastoiditis or any complicating features of OM on exam.  Pt is afebrile here.  I don't see what xrays to take that are necessary.  Pt's abx changed to omnicef.  She is also given afrin and flonase.  She is to return if worse.  F/u with pcp.  Final Clinical Impression(s) / ED Diagnoses Final diagnoses:  Non-recurrent acute suppurative otitis media of left ear without spontaneous rupture of tympanic membrane    Rx / DC Orders ED Discharge Orders         Ordered    cefdinir (OMNICEF) 300 MG capsule  2 times daily        09/27/20 0914    oxymetazoline (AFRIN NASAL SPRAY) 0.05 % nasal spray  2 times daily        09/27/20 0916    fluticasone (FLONASE) 50 MCG/ACT nasal spray  Daily        09/27/20 0916           Isla Pence, MD 09/27/20 [phone]

## 2020-09-27 NOTE — Discharge Instructions (Addendum)
Stop Doxycycline.  Start Cefdinir ([place]).  Continue ear drops.  I am also going to add Afrin and Flonase to help clean out the tubes in your ears.

## 2020-09-27 NOTE — ED Triage Notes (Signed)
Pt presented to ED with left ear pain. Pt went to Dr. On Monday and gave you some ear gtts and Doxycycline x 10 days. Dr. Lenon Ahmadi come to ED for X-rays due to fevers. Temp 99.4.

## 2020-10-01 DIAGNOSIS — Z1331 Encounter for screening for depression: Secondary | ICD-10-CM | POA: Diagnosis not present

## 2020-10-01 DIAGNOSIS — Z6834 Body mass index (BMI) 34.0-34.9, adult: Secondary | ICD-10-CM | POA: Diagnosis not present

## 2020-10-01 DIAGNOSIS — M25512 Pain in left shoulder: Secondary | ICD-10-CM | POA: Diagnosis not present

## 2020-10-01 DIAGNOSIS — H6092 Unspecified otitis externa, left ear: Secondary | ICD-10-CM | POA: Diagnosis not present

## 2020-10-01 DIAGNOSIS — H6692 Otitis media, unspecified, left ear: Secondary | ICD-10-CM | POA: Diagnosis not present

## 2020-10-01 DIAGNOSIS — B029 Zoster without complications: Secondary | ICD-10-CM | POA: Diagnosis not present

## 2020-10-05 DIAGNOSIS — E7849 Other hyperlipidemia: Secondary | ICD-10-CM | POA: Diagnosis not present

## 2020-10-05 DIAGNOSIS — I10 Essential (primary) hypertension: Secondary | ICD-10-CM | POA: Diagnosis not present

## 2020-10-23 ENCOUNTER — Other Ambulatory Visit (HOSPITAL_COMMUNITY): Payer: Self-pay | Admitting: Family Medicine

## 2020-10-23 ENCOUNTER — Ambulatory Visit (HOSPITAL_COMMUNITY)
Admission: RE | Admit: 2020-10-23 | Discharge: 2020-10-23 | Disposition: A | Discharge status: Home / Self Care | Payer: Medicare Other | Source: Ambulatory Visit | Attending: Family Medicine | Admitting: Family Medicine

## 2020-10-23 DIAGNOSIS — M542 Cervicalgia: Secondary | ICD-10-CM | POA: Insufficient documentation

## 2020-10-23 DIAGNOSIS — Z6834 Body mass index (BMI) 34.0-34.9, adult: Secondary | ICD-10-CM | POA: Diagnosis not present

## 2020-10-23 DIAGNOSIS — E6609 Other obesity due to excess calories: Secondary | ICD-10-CM | POA: Diagnosis not present

## 2020-12-02 DIAGNOSIS — H43813 Vitreous degeneration, bilateral: Secondary | ICD-10-CM | POA: Diagnosis not present

## 2021-01-30 DIAGNOSIS — X32XXXD Exposure to sunlight, subsequent encounter: Secondary | ICD-10-CM | POA: Diagnosis not present

## 2021-01-30 DIAGNOSIS — L821 Other seborrheic keratosis: Secondary | ICD-10-CM | POA: Diagnosis not present

## 2021-01-30 DIAGNOSIS — L570 Actinic keratosis: Secondary | ICD-10-CM | POA: Diagnosis not present

## 2021-03-26 DIAGNOSIS — Z23 Encounter for immunization: Secondary | ICD-10-CM | POA: Diagnosis not present

## 2021-04-23 DIAGNOSIS — I10 Essential (primary) hypertension: Secondary | ICD-10-CM | POA: Diagnosis not present

## 2021-04-23 DIAGNOSIS — R7309 Other abnormal glucose: Secondary | ICD-10-CM | POA: Diagnosis not present

## 2021-04-23 DIAGNOSIS — Z1331 Encounter for screening for depression: Secondary | ICD-10-CM | POA: Diagnosis not present

## 2021-04-23 DIAGNOSIS — E6609 Other obesity due to excess calories: Secondary | ICD-10-CM | POA: Diagnosis not present

## 2021-04-23 DIAGNOSIS — E782 Mixed hyperlipidemia: Secondary | ICD-10-CM | POA: Diagnosis not present

## 2021-04-23 DIAGNOSIS — Z6835 Body mass index (BMI) 35.0-35.9, adult: Secondary | ICD-10-CM | POA: Diagnosis not present

## 2021-04-23 DIAGNOSIS — E7849 Other hyperlipidemia: Secondary | ICD-10-CM | POA: Diagnosis not present

## 2021-04-23 DIAGNOSIS — Z0000 Encounter for general adult medical examination without abnormal findings: Secondary | ICD-10-CM | POA: Diagnosis not present

## 2021-07-04 DIAGNOSIS — J329 Chronic sinusitis, unspecified: Secondary | ICD-10-CM | POA: Diagnosis not present

## 2021-07-04 DIAGNOSIS — I10 Essential (primary) hypertension: Secondary | ICD-10-CM | POA: Diagnosis not present

## 2021-07-04 DIAGNOSIS — Z6834 Body mass index (BMI) 34.0-34.9, adult: Secondary | ICD-10-CM | POA: Diagnosis not present

## 2021-07-04 DIAGNOSIS — E669 Obesity, unspecified: Secondary | ICD-10-CM | POA: Diagnosis not present

## 2021-11-13 DIAGNOSIS — H25813 Combined forms of age-related cataract, bilateral: Secondary | ICD-10-CM | POA: Diagnosis not present

## 2021-12-18 DIAGNOSIS — J069 Acute upper respiratory infection, unspecified: Secondary | ICD-10-CM | POA: Diagnosis not present

## 2021-12-18 DIAGNOSIS — E6609 Other obesity due to excess calories: Secondary | ICD-10-CM | POA: Diagnosis not present

## 2021-12-18 DIAGNOSIS — Z6835 Body mass index (BMI) 35.0-35.9, adult: Secondary | ICD-10-CM | POA: Diagnosis not present

## 2022-02-20 DIAGNOSIS — Z6834 Body mass index (BMI) 34.0-34.9, adult: Secondary | ICD-10-CM | POA: Diagnosis not present

## 2022-02-20 DIAGNOSIS — J302 Other seasonal allergic rhinitis: Secondary | ICD-10-CM | POA: Diagnosis not present

## 2022-02-20 DIAGNOSIS — J069 Acute upper respiratory infection, unspecified: Secondary | ICD-10-CM | POA: Diagnosis not present

## 2022-02-20 DIAGNOSIS — E6609 Other obesity due to excess calories: Secondary | ICD-10-CM | POA: Diagnosis not present

## 2022-04-08 DIAGNOSIS — Z23 Encounter for immunization: Secondary | ICD-10-CM | POA: Diagnosis not present

## 2022-05-25 DIAGNOSIS — Z6834 Body mass index (BMI) 34.0-34.9, adult: Secondary | ICD-10-CM | POA: Diagnosis not present

## 2022-05-25 DIAGNOSIS — I10 Essential (primary) hypertension: Secondary | ICD-10-CM | POA: Diagnosis not present

## 2022-05-25 DIAGNOSIS — E782 Mixed hyperlipidemia: Secondary | ICD-10-CM | POA: Diagnosis not present

## 2022-05-25 DIAGNOSIS — E119 Type 2 diabetes mellitus without complications: Secondary | ICD-10-CM | POA: Diagnosis not present

## 2022-05-25 DIAGNOSIS — R7309 Other abnormal glucose: Secondary | ICD-10-CM | POA: Diagnosis not present

## 2022-05-25 DIAGNOSIS — E6609 Other obesity due to excess calories: Secondary | ICD-10-CM | POA: Diagnosis not present

## 2022-05-25 DIAGNOSIS — E7849 Other hyperlipidemia: Secondary | ICD-10-CM | POA: Diagnosis not present

## 2022-06-16 DIAGNOSIS — I10 Essential (primary) hypertension: Secondary | ICD-10-CM | POA: Diagnosis not present

## 2022-06-16 DIAGNOSIS — Z1331 Encounter for screening for depression: Secondary | ICD-10-CM | POA: Diagnosis not present

## 2022-06-16 DIAGNOSIS — E6609 Other obesity due to excess calories: Secondary | ICD-10-CM | POA: Diagnosis not present

## 2022-06-16 DIAGNOSIS — Z6834 Body mass index (BMI) 34.0-34.9, adult: Secondary | ICD-10-CM | POA: Diagnosis not present

## 2022-06-16 DIAGNOSIS — Z0001 Encounter for general adult medical examination with abnormal findings: Secondary | ICD-10-CM | POA: Diagnosis not present

## 2022-06-16 DIAGNOSIS — E119 Type 2 diabetes mellitus without complications: Secondary | ICD-10-CM | POA: Diagnosis not present

## 2022-06-16 DIAGNOSIS — E782 Mixed hyperlipidemia: Secondary | ICD-10-CM | POA: Diagnosis not present

## 2022-12-22 DIAGNOSIS — H01004 Unspecified blepharitis left upper eyelid: Secondary | ICD-10-CM | POA: Diagnosis not present

## 2022-12-22 DIAGNOSIS — H2513 Age-related nuclear cataract, bilateral: Secondary | ICD-10-CM | POA: Diagnosis not present

## 2022-12-22 DIAGNOSIS — H02413 Mechanical ptosis of bilateral eyelids: Secondary | ICD-10-CM | POA: Diagnosis not present

## 2022-12-22 DIAGNOSIS — H01001 Unspecified blepharitis right upper eyelid: Secondary | ICD-10-CM | POA: Diagnosis not present

## 2023-01-08 DIAGNOSIS — H2511 Age-related nuclear cataract, right eye: Secondary | ICD-10-CM | POA: Diagnosis not present

## 2023-01-11 ENCOUNTER — Encounter (HOSPITAL_COMMUNITY): Payer: Self-pay

## 2023-01-11 ENCOUNTER — Encounter (HOSPITAL_COMMUNITY)
Admission: RE | Admit: 2023-01-11 | Discharge: 2023-01-11 | Disposition: A | Discharge status: Home / Self Care | Payer: Medicare Other | Source: Ambulatory Visit | Attending: Optometry | Admitting: Optometry

## 2023-01-13 NOTE — H&P (Signed)
Surgical History & Physical  Patient Name: Heather Hale  DOB: [birth year]  Surgery: Cataract extraction with intraocular lens implant phacoemulsification; Right Eye Surgeon: Pecolia Ades MD Surgery Date: 01/15/2023 Pre-Op Date: 12/22/2022  HPI: A 8 Yr. old female patient present for cataract sx per Dr. Charise Killian. 1. The patient complains of sunlight glare and at night causing poor vision, which began for an unknown amount of time. Patient does not like to drive at night. Both eyes are affected. The episode is constant. The condition's severity is worsening.  Medical History: Cataracts Glaucoma  High Blood Pressure LDL  Review of Systems Cardiovascular High Blood Pressure All recorded systems are negative except as noted above.  Social Never smoked   Medication losartan-hydrochlorothiazide, simvastatin   Sx/Procedures Hysterectomy  Drug Allergies  penicillin   History & Physical: Heent: cataracts NECK: supple without bruits LUNGS: lungs clear to auscultation CV: regular rate and rhythm Abdomen: soft and non-tender  Impression & Plan: Assessment: 1.  CATARACT AGE-RELATED NUCLEAR; Both Eyes (H25.13) 2.  Hyperopia ; Both Eyes (H52.03) 3.  BLEPHARITIS; Right Upper Lid, Left Upper Lid (H01.001, H01.004) 4.  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  Cataracts are visually significant and account for the patient's complaints. Discussed all risks, benefits, procedures and recovery, including infection, loss of vision and eye, need for glasses after surgery or additional procedures. Patient understands changing glasses will not improve vision. Patient indicated understanding of procedure. All questions answered. Patient desires to have surgery, recommend phacoemulsification with intraocular lens. Patient to have preliminary testing necessary ([employer]/IOL Master, Mac OCT, TOPO) Educational materials  provided:Cataract.  Plan: - Patient would like to proceed with cataract surgery. Will do OD first - DIB00 lens with best distance target - ok with lying flat - no prior eye surgeries, no DM, no fuchs  2.  Per Dr. Charise Killian  3.  Blepharitis- Recommend Artificial tears 4 x day and warm compresses with lid scrubs 2-3 x week. Patient may also benefit from omega-3 vitamin and tear gel/ointment at night. Avoid directing fans or vents on the eyes and consider a humidifier in the winter months. Patient understands this may not cure the problem and may need to be on a maintenance regimen to control the problem.  4.  Findings, prognosis and treatment options reviewed. Not visually/clinically significant, recommend observation.  5.  Will proceed with cataract surgery

## 2023-01-15 ENCOUNTER — Encounter (HOSPITAL_COMMUNITY): Payer: Self-pay | Admitting: Optometry

## 2023-01-15 ENCOUNTER — Encounter (HOSPITAL_COMMUNITY)
Admission: RE | Disposition: A | Discharge status: Home / Self Care | Payer: Self-pay | Source: Ambulatory Visit | Attending: Optometry

## 2023-01-15 ENCOUNTER — Ambulatory Visit (HOSPITAL_BASED_OUTPATIENT_CLINIC_OR_DEPARTMENT_OTHER): Payer: Medicare Other | Admitting: Anesthesiology

## 2023-01-15 ENCOUNTER — Ambulatory Visit (HOSPITAL_COMMUNITY): Payer: Medicare Other | Admitting: Anesthesiology

## 2023-01-15 ENCOUNTER — Ambulatory Visit (HOSPITAL_COMMUNITY)
Admission: RE | Admit: 2023-01-15 | Discharge: 2023-01-15 | Disposition: A | Discharge status: Home / Self Care | Payer: Medicare Other | Source: Ambulatory Visit | Attending: Optometry | Admitting: Optometry

## 2023-01-15 DIAGNOSIS — H02413 Mechanical ptosis of bilateral eyelids: Secondary | ICD-10-CM | POA: Diagnosis not present

## 2023-01-15 DIAGNOSIS — H5203 Hypermetropia, bilateral: Secondary | ICD-10-CM | POA: Insufficient documentation

## 2023-01-15 DIAGNOSIS — H40033 Anatomical narrow angle, bilateral: Secondary | ICD-10-CM | POA: Diagnosis not present

## 2023-01-15 DIAGNOSIS — H2511 Age-related nuclear cataract, right eye: Secondary | ICD-10-CM

## 2023-01-15 DIAGNOSIS — H01004 Unspecified blepharitis left upper eyelid: Secondary | ICD-10-CM | POA: Diagnosis not present

## 2023-01-15 DIAGNOSIS — I10 Essential (primary) hypertension: Secondary | ICD-10-CM | POA: Diagnosis not present

## 2023-01-15 DIAGNOSIS — H01001 Unspecified blepharitis right upper eyelid: Secondary | ICD-10-CM | POA: Insufficient documentation

## 2023-01-15 HISTORY — PX: CATARACT EXTRACTION W/PHACO: SHX586

## 2023-01-15 SURGERY — PHACOEMULSIFICATION, CATARACT, WITH IOL INSERTION
Anesthesia: Monitor Anesthesia Care | Site: Eye | Laterality: Right

## 2023-01-15 MED ORDER — TROPICAMIDE 1 % OP SOLN
1.0000 [drp] | OPHTHALMIC | Status: AC
  Administered 2023-01-15 (×3): 1 [drp] via OPHTHALMIC

## 2023-01-15 MED ORDER — FENTANYL CITRATE (PF) 100 MCG/2ML IJ SOLN
INTRAMUSCULAR | Status: DC | PRN
  Administered 2023-01-15: 25 ug via INTRAVENOUS

## 2023-01-15 MED ORDER — LIDOCAINE HCL (PF) 1 % IJ SOLN
INTRAMUSCULAR | Status: DC | PRN
  Administered 2023-01-15: 2 mL

## 2023-01-15 MED ORDER — TETRACAINE HCL 0.5 % OP SOLN
1.0000 [drp] | OPHTHALMIC | Status: AC
  Administered 2023-01-15 (×3): 1 [drp] via OPHTHALMIC

## 2023-01-15 MED ORDER — PHENYLEPHRINE HCL 2.5 % OP SOLN
1.0000 [drp] | OPHTHALMIC | Status: AC
  Administered 2023-01-15 (×3): 1 [drp] via OPHTHALMIC

## 2023-01-15 MED ORDER — SIGHTPATH DOSE#1 NA HYALUR & NA CHOND-NA HYALUR IO KIT
PACK | INTRAOCULAR | Status: DC | PRN
  Administered 2023-01-15: 1 via OPHTHALMIC

## 2023-01-15 MED ORDER — STERILE WATER FOR IRRIGATION IR SOLN
Status: DC | PRN
  Administered 2023-01-15: 1

## 2023-01-15 MED ORDER — BSS IO SOLN
INTRAOCULAR | Status: DC | PRN
  Administered 2023-01-15: 15 mL via INTRAOCULAR

## 2023-01-15 MED ORDER — LIDOCAINE HCL 3.5 % OP GEL
1.0000 | Freq: Once | OPHTHALMIC | Status: AC
  Administered 2023-01-15: 1 via OPHTHALMIC

## 2023-01-15 MED ORDER — POVIDONE-IODINE 5 % OP SOLN
OPHTHALMIC | Status: DC | PRN
  Administered 2023-01-15: 1 via OPHTHALMIC

## 2023-01-15 MED ORDER — FENTANYL CITRATE (PF) 100 MCG/2ML IJ SOLN
INTRAMUSCULAR | Status: AC
  Filled 2023-01-15: qty 2

## 2023-01-15 MED ORDER — SODIUM CHLORIDE 0.9% FLUSH
INTRAVENOUS | Status: DC | PRN
  Administered 2023-01-15: 3 mL via INTRAVENOUS

## 2023-01-15 MED ORDER — NEOMYCIN-POLYMYXIN-DEXAMETH 3.5-10000-0.1 OP SUSP
OPHTHALMIC | Status: DC | PRN
  Administered 2023-01-15: 2 [drp] via OPHTHALMIC

## 2023-01-15 MED ORDER — PHENYLEPHRINE-KETOROLAC 1-0.3 % IO SOLN
INTRAOCULAR | Status: DC | PRN
  Administered 2023-01-15: 500 mL via OPHTHALMIC

## 2023-01-15 SURGICAL SUPPLY — 15 items
CATARACT SUITE SIGHTPATH (MISCELLANEOUS) ×1
CLOTH BEACON ORANGE TIMEOUT ST (SAFETY) ×1 IMPLANT
DRSG TEGADERM 4X4.75 (GAUZE/BANDAGES/DRESSINGS) ×1 IMPLANT
EYE SHIELD UNIVERSAL CLEAR (GAUZE/BANDAGES/DRESSINGS) IMPLANT
FEE CATARACT SUITE SIGHTPATH (MISCELLANEOUS) ×1 IMPLANT
GLOVE BIOGEL PI IND STRL 7.0 (GLOVE) ×2 IMPLANT
LENS IOL TECNIS EYHANCE 21.5 (Intraocular Lens) IMPLANT
NDL HYPO 18GX1.5 BLUNT FILL (NEEDLE) ×1 IMPLANT
NEEDLE HYPO 18GX1.5 BLUNT FILL (NEEDLE) ×1
PAD ARMBOARD 7.5X6 YLW CONV (MISCELLANEOUS) ×1 IMPLANT
POSITIONER HEAD 8X9X4 ADT (SOFTGOODS) ×1 IMPLANT
RING MALYGIN 7.0 (MISCELLANEOUS) IMPLANT
SYR TB 1ML LL NO SAFETY (SYRINGE) ×1 IMPLANT
TAPE SURG TRANSPORE 1 IN (GAUZE/BANDAGES/DRESSINGS) IMPLANT
WATER STERILE IRR 250ML POUR (IV SOLUTION) ×1 IMPLANT

## 2023-01-15 NOTE — Op Note (Signed)
Date of procedure: 01/15/23  Pre-operative diagnosis: Visually significant age-related nuclear cataract, Right Eye (H25.11)  Post-operative diagnosis: Visually significant age-related nuclear cataract, Right Eye  Procedure: Removal of cataract via phacoemulsification and insertion of intra-ocular lens J&J DIBOO +21.5D into the capsular bag of the Right Eye  Attending surgeon: Pecolia Ades, MD  Anesthesia: MAC, Topical Akten  Complications: None  Estimated Blood Loss: <8mL (minimal)  Specimens: None  Implants:  Implant Name Type Inv. Item Serial No. Manufacturer Lot No. LRB No. Used Action  LENS IOL TECNIS EYHANCE 21.5 - [id number] Intraocular Lens LENS IOL TECNIS EYHANCE 21.5 [id number] [employer]  Right 1 Implanted    Indications:  Visually significant age-related cataract, Right Eye  Procedure:  The patient was seen and identified in the pre-operative area. The operative eye was identified and dilated.  The operative eye was marked.  Topical anesthesia was administered to the operative eye.     The patient was then to the operative suite and placed in the supine position.  A timeout was performed confirming the patient, procedure to be performed, and all other relevant information.   The patient's face was prepped and draped in the usual fashion for intra-ocular surgery.  A lid speculum was placed into the operative eye and the surgical microscope moved into place and focused.  A superotemporal paracentesis was created using a 20 gauge paracentesis blade.  BSS mixed with Omidria, followed by 1% lidocaine was injected into the anterior chamber.  Viscoelastic was injected into the anterior chamber.  A temporal clear-corneal main wound incision was created using a 2.18mm microkeratome.  A continuous curvilinear capsulorrhexis was initiated using an irrigating cystitome and completed using capsulorrhexis forceps.  Hydrodissection and hydrodeliniation were performed.  Viscoelastic was  injected into the anterior chamber.  A phacoemulsification handpiece and a chopper as a second instrument were used to remove the nucleus and epinucleus. The irrigation/aspiration handpiece was used to remove any remaining cortical material.   The capsular bag was reinflated with viscoelastic, checked, and found to be intact.  The intraocular lens was inserted into the capsular bag.  The irrigation/aspiration handpiece was used to remove any remaining viscoelastic.  The clear corneal wound and paracentesis wounds were then hydrated and checked with Weck-Cels to be watertight.  The lid-speculum and drape was removed, and the patient's face was cleaned with a wet and dry 4x4.  Maxitrol drops were instilled onto the eye. A clear shield was taped over the eye. The patient was taken to the post-operative care unit in good condition, having tolerated the procedure well.  Post-Op Instructions: The patient will follow up at [facility] for a same day post-operative evaluation and will receive all other orders and instructions.

## 2023-01-15 NOTE — Anesthesia Preprocedure Evaluation (Signed)
Anesthesia Evaluation  Patient identified by MRN, date of birth, ID band Patient awake    Reviewed: Allergy & Precautions, H&P , NPO status , Patient's Chart, lab work & pertinent test results  Airway Mallampati: II  TM Distance: >3 FB Neck ROM: Full    Dental  (+) Dental Advisory Given, Upper Dentures   Pulmonary neg pulmonary ROS   Pulmonary exam normal breath sounds clear to auscultation       Cardiovascular hypertension, Pt. on medications Normal cardiovascular exam Rhythm:Regular Rate:Normal     Neuro/Psych negative neurological ROS  negative psych ROS   GI/Hepatic negative GI ROS, Neg liver ROS,,,  Endo/Other  negative endocrine ROS    Renal/GU negative Renal ROS  negative genitourinary   Musculoskeletal negative musculoskeletal ROS (+)    Abdominal   Peds negative pediatric ROS (+)  Hematology negative hematology ROS (+)   Anesthesia Other Findings   Reproductive/Obstetrics negative OB ROS                             Anesthesia Physical Anesthesia Plan  ASA: 2  Anesthesia Plan: MAC   Post-op Pain Management: Minimal or no pain anticipated   Induction: Intravenous  PONV Risk Score and Plan: Treatment may vary due to age or medical condition  Airway Management Planned: Nasal Cannula and Natural Airway  Additional Equipment:   Intra-op Plan:   Post-operative Plan:   Informed Consent: I have reviewed the patients History and Physical, chart, labs and discussed the procedure including the risks, benefits and alternatives for the proposed anesthesia with the patient or authorized representative who has indicated his/her understanding and acceptance.     Dental advisory given  Plan Discussed with: CRNA and Surgeon  Anesthesia Plan Comments:        Anesthesia Quick Evaluation

## 2023-01-15 NOTE — Transfer of Care (Signed)
Immediate Anesthesia Transfer of Care Note  Patient: Heather Hale  Procedure(s) Performed: CATARACT EXTRACTION PHACO AND INTRAOCULAR LENS PLACEMENT (IOC) (Right: Eye)  Patient Location: Short Stay  Anesthesia Type:MAC  Level of Consciousness: awake and patient cooperative  Airway & Oxygen Therapy: Patient Spontanous Breathing  Post-op Assessment: Report given to RN and Post -op Vital signs reviewed and stable  Post vital signs: Reviewed and stable  Last Vitals:  Vitals Value Taken Time  BP 155/86 01/15/23 0825  Temp 98.1 01/15/23   0825  Pulse 84 01/15/23 0826  Resp 14 01/15/23 0826  SpO2 95 % 01/15/23 0826  Vitals shown include unfiled device data.  Last Pain:  Vitals:   01/15/23 0656  TempSrc: Oral  PainSc: 0-No pain         Complications: No notable events documented.

## 2023-01-15 NOTE — Discharge Instructions (Signed)
Please discharge patient when stable, will follow up today with Dr. Snyder at the [facility] [place] office immediately following discharge.  Leave shield in place until visit.  All paperwork with discharge instructions will be given at the office.  [facility] Address:  [address]  [place], [place] [address]  Dr. Snyder's Phone: [phone]  

## 2023-01-15 NOTE — Anesthesia Procedure Notes (Signed)
Date/Time: 01/15/2023 8:01 AM  Performed by: Franco Nones, CRNAPre-anesthesia Checklist: Patient identified, Emergency Drugs available, Suction available, Timeout performed and Patient being monitored Patient Re-evaluated:Patient Re-evaluated prior to induction Oxygen Delivery Method: Nasal Cannula

## 2023-01-15 NOTE — Interval H&P Note (Signed)
History and Physical Interval Note:  01/15/2023 7:17 AM  The H and P was reviewed and updated. The patient was examined.  No changes were found after exam.  The surgical eye was marked.  Heather Hale

## 2023-01-15 NOTE — Anesthesia Postprocedure Evaluation (Signed)
Anesthesia Post Note  Patient: Heather Hale  Procedure(s) Performed: CATARACT EXTRACTION PHACO AND INTRAOCULAR LENS PLACEMENT (IOC) (Right: Eye)  Patient location during evaluation: Phase II Anesthesia Type: MAC Level of consciousness: awake and alert and oriented Pain management: pain level controlled Vital Signs Assessment: post-procedure vital signs reviewed and stable Respiratory status: spontaneous breathing, nonlabored ventilation and respiratory function stable Cardiovascular status: blood pressure returned to baseline and stable Postop Assessment: no apparent nausea or vomiting Anesthetic complications: no  No notable events documented.   Last Vitals:  Vitals:   01/15/23 0656 01/15/23 0826  BP: (!) 169/94 (!) 155/86  Pulse: 85 84  Resp: 14 17  Temp: 37 C 36.7 C  SpO2: 97% 96%    Last Pain:  Vitals:   01/15/23 0826  TempSrc: Oral  PainSc: 0-No pain                 Darien Kading C Jazmynn Pho

## 2023-01-19 ENCOUNTER — Encounter (HOSPITAL_COMMUNITY): Payer: Self-pay | Admitting: Optometry

## 2023-01-22 DIAGNOSIS — H2512 Age-related nuclear cataract, left eye: Secondary | ICD-10-CM | POA: Diagnosis not present

## 2023-01-22 NOTE — H&P (Signed)
Surgical History & Physical  Patient Name: Heather Hale  DOB: Sep 05, 1940  Surgery: Cataract extraction with intraocular lens implant phacoemulsification; Left Eye Surgeon: Pecolia Ades MD Surgery Date: 01/29/2023 Pre-Op Date: 01/20/2023  HPI: A 59 Yr. old female patient presents for a 5 day CEIOL PO OD and cataract PreOP OS. Patient reports compliance with use of her sx gtts as directed. Patient states that she has noticed an significant improvement in clarity with OD. Patient states that she has difficulty reading small print OS. Patient states that she is bothered by the glare she has both day and night. Patient states that she has poor hazy blurred vision. Patient states that the current states of her vision OS making driving difficult day and night.  Medical History: Cataracts Glaucoma  High Blood Pressure LDL  Review of Systems Cardiovascular High Blood Pressure All recorded systems are negative except as noted above.  Social Never smoked   Medication Prednisolone-moxiflox-bromfen,  losartan-hydrochlorothiazide ,  simvastatin   Sx/Procedures Phaco c IOL OD,  Hysterectomy  Drug Allergies  penicillin   History & Physical: Heent: cataracts NECK: supple without bruits LUNGS: lungs clear to auscultation CV: regular rate and rhythm Abdomen: soft and non-tender  Impression & Plan: Assessment: 1.  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  INTRAOCULAR LENS IOL ; Right Eye (Z96.1) 3.  CATARACT AGE-RELATED NUCLEAR; , Left Eye (H25.12) 4.  Myopia ; Right Eye (H52.11)  Plan: 1.  POD#5 Continue with eye drops as directed. Reviewed all post-op precautions. Call with increased redness, swelling, pain or loss of vision. Avoid swimming pools and hot tubs for 1 week.  2.  Stable. Doing well since surgery  3.  Cataracts are visually significant and account for the patient's complaints. Discussed all risks, benefits, procedures and recovery, including infection, loss of  vision and eye, need for glasses after surgery or additional procedures. Patient understands changing glasses will not improve vision. Patient indicated understanding of procedure. All questions answered. Patient desires to have surgery, recommend phacoemulsification with intraocular lens. Patient to have preliminary testing necessary ([employer]/IOL Master, Mac OCT, TOPO) Educational materials provided:Cataract.  Plan: - Proceed with surgery OS when ready - DIB00 lens with best distance target - ok with lying flat - no prior eye surgeries, no DM, no fuchs  4.  Continue care with optometrist

## 2023-01-27 ENCOUNTER — Encounter (HOSPITAL_COMMUNITY)
Admission: RE | Admit: 2023-01-27 | Discharge: 2023-01-27 | Disposition: A | Discharge status: Home / Self Care | Payer: Medicare Other | Source: Ambulatory Visit | Attending: Optometry | Admitting: Optometry

## 2023-01-27 ENCOUNTER — Encounter (HOSPITAL_COMMUNITY): Payer: Self-pay

## 2023-01-29 ENCOUNTER — Ambulatory Visit (HOSPITAL_BASED_OUTPATIENT_CLINIC_OR_DEPARTMENT_OTHER): Payer: Medicare Other | Admitting: Certified Registered"

## 2023-01-29 ENCOUNTER — Ambulatory Visit (HOSPITAL_COMMUNITY): Payer: Medicare Other | Admitting: Certified Registered"

## 2023-01-29 ENCOUNTER — Encounter (HOSPITAL_COMMUNITY)
Admission: RE | Disposition: A | Discharge status: Home / Self Care | Payer: Self-pay | Source: Ambulatory Visit | Attending: Optometry

## 2023-01-29 ENCOUNTER — Ambulatory Visit (HOSPITAL_COMMUNITY)
Admission: RE | Admit: 2023-01-29 | Discharge: 2023-01-29 | Disposition: A | Discharge status: Home / Self Care | Payer: Medicare Other | Source: Ambulatory Visit | Attending: Optometry | Admitting: Optometry

## 2023-01-29 DIAGNOSIS — Z79899 Other long term (current) drug therapy: Secondary | ICD-10-CM | POA: Diagnosis not present

## 2023-01-29 DIAGNOSIS — H2512 Age-related nuclear cataract, left eye: Secondary | ICD-10-CM

## 2023-01-29 DIAGNOSIS — I10 Essential (primary) hypertension: Secondary | ICD-10-CM | POA: Diagnosis not present

## 2023-01-29 HISTORY — PX: CATARACT EXTRACTION W/PHACO: SHX586

## 2023-01-29 SURGERY — PHACOEMULSIFICATION, CATARACT, WITH IOL INSERTION
Anesthesia: Monitor Anesthesia Care | Site: Eye | Laterality: Left

## 2023-01-29 MED ORDER — STERILE WATER FOR IRRIGATION IR SOLN
Status: DC | PRN
  Administered 2023-01-29: 1

## 2023-01-29 MED ORDER — PHENYLEPHRINE HCL 2.5 % OP SOLN
1.0000 [drp] | OPHTHALMIC | Status: AC | PRN
  Administered 2023-01-29 (×3): 1 [drp] via OPHTHALMIC

## 2023-01-29 MED ORDER — LIDOCAINE HCL (PF) 1 % IJ SOLN
INTRAMUSCULAR | Status: DC | PRN
  Administered 2023-01-29: 1 mL

## 2023-01-29 MED ORDER — MOXIFLOXACIN 0.1% OPHTHALMIC SOLUTION (0.1ML)
INJECTION | OPHTHALMIC | Status: DC | PRN
  Administered 2023-01-29: 1 mL via OPHTHALMIC

## 2023-01-29 MED ORDER — POVIDONE-IODINE 5 % OP SOLN
OPHTHALMIC | Status: DC | PRN
  Administered 2023-01-29: 1 via OPHTHALMIC

## 2023-01-29 MED ORDER — TETRACAINE HCL 0.5 % OP SOLN
1.0000 [drp] | OPHTHALMIC | Status: AC | PRN
  Administered 2023-01-29 (×3): 1 [drp] via OPHTHALMIC

## 2023-01-29 MED ORDER — SIGHTPATH DOSE#1 NA HYALUR & NA CHOND-NA HYALUR IO KIT
PACK | INTRAOCULAR | Status: DC | PRN
  Administered 2023-01-29: 1 via OPHTHALMIC

## 2023-01-29 MED ORDER — SODIUM CHLORIDE 0.9% FLUSH
INTRAVENOUS | Status: DC | PRN
  Administered 2023-01-29: 5 mL via INTRAVENOUS

## 2023-01-29 MED ORDER — FENTANYL CITRATE (PF) 100 MCG/2ML IJ SOLN
INTRAMUSCULAR | Status: AC
  Filled 2023-01-29: qty 2

## 2023-01-29 MED ORDER — BSS IO SOLN
INTRAOCULAR | Status: DC | PRN
  Administered 2023-01-29: 15 mL via INTRAOCULAR

## 2023-01-29 MED ORDER — LIDOCAINE HCL 3.5 % OP GEL
1.0000 | Freq: Once | OPHTHALMIC | Status: AC
  Administered 2023-01-29: 1 via OPHTHALMIC

## 2023-01-29 MED ORDER — PHENYLEPHRINE-KETOROLAC 1-0.3 % IO SOLN
INTRAOCULAR | Status: DC | PRN
  Administered 2023-01-29: 500 mL via OPHTHALMIC

## 2023-01-29 MED ORDER — TROPICAMIDE 1 % OP SOLN
1.0000 [drp] | OPHTHALMIC | Status: AC | PRN
  Administered 2023-01-29 (×3): 1 [drp] via OPHTHALMIC

## 2023-01-29 MED ORDER — FENTANYL CITRATE (PF) 100 MCG/2ML IJ SOLN
INTRAMUSCULAR | Status: DC | PRN
  Administered 2023-01-29: 50 ug via INTRAVENOUS

## 2023-01-29 SURGICAL SUPPLY — 14 items
CATARACT SUITE SIGHTPATH (MISCELLANEOUS) ×1
CLOTH BEACON ORANGE TIMEOUT ST (SAFETY) ×1 IMPLANT
DRSG TEGADERM 4X4.75 (GAUZE/BANDAGES/DRESSINGS) ×1 IMPLANT
EYE SHIELD UNIVERSAL CLEAR (GAUZE/BANDAGES/DRESSINGS) IMPLANT
FEE CATARACT SUITE SIGHTPATH (MISCELLANEOUS) ×1 IMPLANT
GLOVE BIOGEL PI IND STRL 7.0 (GLOVE) ×2 IMPLANT
LENS IOL TECNIS EYHANCE 21.0 (Intraocular Lens) IMPLANT
NDL HYPO 18GX1.5 BLUNT FILL (NEEDLE) ×1 IMPLANT
NEEDLE HYPO 18GX1.5 BLUNT FILL (NEEDLE) ×1
PAD ARMBOARD 7.5X6 YLW CONV (MISCELLANEOUS) ×1 IMPLANT
POSITIONER HEAD 8X9X4 ADT (SOFTGOODS) ×1 IMPLANT
SYR TB 1ML LL NO SAFETY (SYRINGE) ×1 IMPLANT
TAPE SURG TRANSPORE 1 IN (GAUZE/BANDAGES/DRESSINGS) IMPLANT
WATER STERILE IRR 250ML POUR (IV SOLUTION) ×1 IMPLANT

## 2023-01-29 NOTE — Transfer of Care (Signed)
Immediate Anesthesia Transfer of Care Note  Patient: Heather Hale  Procedure(s) Performed: CATARACT EXTRACTION PHACO AND INTRAOCULAR LENS PLACEMENT (IOC) (Left: Eye)  Patient Location: Short Stay  Anesthesia Type:MAC  Level of Consciousness: awake, alert , and patient cooperative  Airway & Oxygen Therapy: Patient Spontanous Breathing  Post-op Assessment: Report given to RN and Post -op Vital signs reviewed and stable  Post vital signs: Reviewed and stable  Last Vitals:  Vitals Value Taken Time  BP    Temp    Pulse    Resp    SpO2      Last Pain:  Vitals:   01/29/23 1103  PainSc: 0-No pain         Complications: No notable events documented.

## 2023-01-29 NOTE — Interval H&P Note (Signed)
History and Physical Interval Note:  01/29/2023 11:29 AM  The H and P was reviewed and updated. The patient was examined.  No changes were found after exam.  The surgical eye was marked.  Liviah Cake

## 2023-01-29 NOTE — Progress Notes (Signed)
Pt states she was exposed to covid this week. Temp 98.3, oral. C/O runny nose and "watery" eyes. Congested cough. Facial mask intact. Stated she did a home covid test this AM and it was negative. Pt states she has had covid in the past. No covid vaccines given in the past. Dr Ilsa Iha and Dr Johnnette Litter notified. OK to proceed with surgery.

## 2023-01-29 NOTE — Op Note (Signed)
Date of procedure: 01/29/23  Pre-operative diagnosis: Visually significant age-related nuclear cataract, Left Eye (H25.12)  Post-operative diagnosis: Visually significant age-related nuclear cataract, Left Eye  Procedure: Removal of cataract via phacoemulsification and insertion of intra-ocular lens J&J DIB00 +21.0D into the capsular bag of the Left Eye  Attending surgeon: Ronal Fear, MD  Anesthesia: MAC, Topical Akten  Complications: None  Estimated Blood Loss: <45mL (minimal)  Specimens: None  Implants:  Implant Name Type Inv. Item Serial No. Manufacturer Lot No. LRB No. Used Action  LENS IOL TECNIS EYHANCE 21.0 - [id number] Intraocular Lens LENS IOL TECNIS EYHANCE 21.0 [id number] [employer]  Left 1 Implanted    Indications:  Visually significant age-related cataract, Left Eye  Procedure:  The patient was seen and identified in the pre-operative area. The operative eye was identified and dilated.  The operative eye was marked.  Topical anesthesia was administered to the operative eye.     The patient was then to the operative suite and placed in the supine position.  A timeout was performed confirming the patient, procedure to be performed, and all other relevant information.   The patient's face was prepped and draped in the usual fashion for intra-ocular surgery.  A lid speculum was placed into the operative eye and the surgical microscope moved into place and focused.  An inferotemporal paracentesis was created using a 20 gauge paracentesis blade.  BSS mixed with Omidria, followed by 1% lidocaine was injected into the anterior chamber.  Viscoelastic was injected into the anterior chamber.  A temporal clear-corneal main wound incision was created using a 2.52mm microkeratome.  A continuous curvilinear capsulorrhexis was initiated using an irrigating cystitome and completed using capsulorrhexis forceps.  Hydrodissection and hydrodeliniation were performed.  Viscoelastic was  injected into the anterior chamber.  A phacoemulsification handpiece and a chopper as a second instrument were used to remove the nucleus and epinucleus. The irrigation/aspiration handpiece was used to remove any remaining cortical material.   The capsular bag was reinflated with viscoelastic, checked, and found to be intact.  The intraocular lens was inserted into the capsular bag.  The irrigation/aspiration handpiece was used to remove any remaining viscoelastic.  The clear corneal wound and paracentesis wounds were then hydrated and checked with Weck-Cels to be watertight.  The lid-speculum and drape was removed, and the patient's face was cleaned with a wet and dry 4x4.  Maxitrol drops were instilled onto the eye. A clear shield was taped over the eye. The patient was taken to the post-operative care unit in good condition, having tolerated the procedure well.  Post-Op Instructions: The patient will follow up at [facility] for a same day post-operative evaluation and will receive all other orders and instructions.

## 2023-01-29 NOTE — Anesthesia Preprocedure Evaluation (Signed)
Anesthesia Evaluation  Patient identified by MRN, date of birth, ID band Patient awake    Reviewed: Allergy & Precautions, H&P , NPO status , Patient's Chart, lab work & pertinent test results  Airway Mallampati: II  TM Distance: >3 FB Neck ROM: Full    Dental  (+) Dental Advisory Given, Upper Dentures   Pulmonary neg pulmonary ROS   Pulmonary exam normal breath sounds clear to auscultation       Cardiovascular hypertension, Pt. on medications Normal cardiovascular exam Rhythm:Regular Rate:Normal     Neuro/Psych negative neurological ROS  negative psych ROS   GI/Hepatic negative GI ROS, Neg liver ROS,,,  Endo/Other  negative endocrine ROS    Renal/GU negative Renal ROS  negative genitourinary   Musculoskeletal negative musculoskeletal ROS (+)    Abdominal   Peds negative pediatric ROS (+)  Hematology negative hematology ROS (+)   Anesthesia Other Findings   Reproductive/Obstetrics negative OB ROS                             Anesthesia Physical Anesthesia Plan  ASA: 2  Anesthesia Plan: MAC   Post-op Pain Management: Minimal or no pain anticipated   Induction: Intravenous  PONV Risk Score and Plan: Treatment may vary due to age or medical condition  Airway Management Planned: Nasal Cannula and Natural Airway  Additional Equipment:   Intra-op Plan:   Post-operative Plan:   Informed Consent: I have reviewed the patients History and Physical, chart, labs and discussed the procedure including the risks, benefits and alternatives for the proposed anesthesia with the patient or authorized representative who has indicated his/her understanding and acceptance.     Dental advisory given  Plan Discussed with: CRNA and Surgeon  Anesthesia Plan Comments:        Anesthesia Quick Evaluation                                  Anesthesia Evaluation  Patient identified by MRN, date  of birth, ID band Patient awake    Reviewed: Allergy & Precautions, H&P , NPO status , Patient's Chart, lab work & pertinent test results, reviewed documented beta blocker date and time   Airway Mallampati: II  TM Distance: >3 FB Neck ROM: full    Dental no notable dental hx.    Pulmonary neg pulmonary ROS   Pulmonary exam normal breath sounds clear to auscultation       Cardiovascular Exercise Tolerance: Good hypertension, negative cardio ROS  Rhythm:regular Rate:Normal     Neuro/Psych negative neurological ROS  negative psych ROS   GI/Hepatic negative GI ROS, Neg liver ROS,,,  Endo/Other  negative endocrine ROS    Renal/GU negative Renal ROS  negative genitourinary   Musculoskeletal   Abdominal   Peds  Hematology negative hematology ROS (+)   Anesthesia Other Findings   Reproductive/Obstetrics negative OB ROS                             Anesthesia Physical Anesthesia Plan  ASA: 2  Anesthesia Plan: General   Post-op Pain Management:    Induction:   PONV Risk Score and Plan:   Airway Management Planned:   Additional Equipment:   Intra-op Plan:   Post-operative Plan:   Informed Consent: I have reviewed the patients History and Physical, chart, labs and discussed the  procedure including the risks, benefits and alternatives for the proposed anesthesia with the patient or authorized representative who has indicated his/her understanding and acceptance.     Dental Advisory Given  Plan Discussed with: CRNA  Anesthesia Plan Comments:        Anesthesia Quick Evaluation

## 2023-01-29 NOTE — Discharge Instructions (Signed)
Please discharge patient when stable, will follow up today with Dr. Snyder at the [facility] [place] office immediately following discharge.  Leave shield in place until visit.  All paperwork with discharge instructions will be given at the office.  [facility] Address:  [address]  [place], [place] [address]  Dr. Snyder's Phone: [phone]  

## 2023-01-30 NOTE — Anesthesia Postprocedure Evaluation (Signed)
Anesthesia Post Note  Patient: Heather Hale  Procedure(s) Performed: CATARACT EXTRACTION PHACO AND INTRAOCULAR LENS PLACEMENT (IOC) (Left: Eye)  Patient location during evaluation: Phase II Anesthesia Type: General Level of consciousness: awake Pain management: pain level controlled Vital Signs Assessment: post-procedure vital signs reviewed and stable Respiratory status: spontaneous breathing and respiratory function stable Cardiovascular status: blood pressure returned to baseline and stable Postop Assessment: no headache and no apparent nausea or vomiting Anesthetic complications: no Comments: Late entry   No notable events documented.   Last Vitals:  Vitals:   01/29/23 1130 01/29/23 1227  BP:  (!) 125/92  Pulse: 91 90  Resp: (!) 23 18  Temp:  37.9 C  SpO2: 96% 98%    Last Pain:  Vitals:   01/29/23 1227  TempSrc: Oral  PainSc: 0-No pain                 Windell Norfolk

## 2023-02-01 ENCOUNTER — Encounter (HOSPITAL_COMMUNITY): Payer: Self-pay | Admitting: Optometry

## 2023-02-24 DIAGNOSIS — L570 Actinic keratosis: Secondary | ICD-10-CM | POA: Diagnosis not present

## 2023-02-24 DIAGNOSIS — X32XXXD Exposure to sunlight, subsequent encounter: Secondary | ICD-10-CM | POA: Diagnosis not present

## 2023-02-24 DIAGNOSIS — L820 Inflamed seborrheic keratosis: Secondary | ICD-10-CM | POA: Diagnosis not present

## 2023-03-25 DIAGNOSIS — Z23 Encounter for immunization: Secondary | ICD-10-CM | POA: Diagnosis not present

## 2023-04-01 DIAGNOSIS — E6609 Other obesity due to excess calories: Secondary | ICD-10-CM | POA: Diagnosis not present

## 2023-04-01 DIAGNOSIS — I10 Essential (primary) hypertension: Secondary | ICD-10-CM | POA: Diagnosis not present

## 2023-04-01 DIAGNOSIS — Z6835 Body mass index (BMI) 35.0-35.9, adult: Secondary | ICD-10-CM | POA: Diagnosis not present

## 2023-04-01 DIAGNOSIS — Z0001 Encounter for general adult medical examination with abnormal findings: Secondary | ICD-10-CM | POA: Diagnosis not present

## 2023-04-01 DIAGNOSIS — E119 Type 2 diabetes mellitus without complications: Secondary | ICD-10-CM | POA: Diagnosis not present

## 2023-04-01 DIAGNOSIS — R7303 Prediabetes: Secondary | ICD-10-CM | POA: Diagnosis not present

## 2023-04-01 DIAGNOSIS — E7849 Other hyperlipidemia: Secondary | ICD-10-CM | POA: Diagnosis not present

## 2023-04-01 DIAGNOSIS — E782 Mixed hyperlipidemia: Secondary | ICD-10-CM | POA: Diagnosis not present

## 2023-04-21 DIAGNOSIS — H524 Presbyopia: Secondary | ICD-10-CM | POA: Diagnosis not present

## 2023-07-08 DIAGNOSIS — Z20828 Contact with and (suspected) exposure to other viral communicable diseases: Secondary | ICD-10-CM | POA: Diagnosis not present

## 2023-07-08 DIAGNOSIS — R6889 Other general symptoms and signs: Secondary | ICD-10-CM | POA: Diagnosis not present

## 2023-07-08 DIAGNOSIS — Z6835 Body mass index (BMI) 35.0-35.9, adult: Secondary | ICD-10-CM | POA: Diagnosis not present

## 2023-07-08 DIAGNOSIS — U071 COVID-19: Secondary | ICD-10-CM | POA: Diagnosis not present

## 2023-08-18 DIAGNOSIS — H26492 Other secondary cataract, left eye: Secondary | ICD-10-CM | POA: Diagnosis not present

## 2023-11-08 DIAGNOSIS — H04123 Dry eye syndrome of bilateral lacrimal glands: Secondary | ICD-10-CM | POA: Diagnosis not present

## 2023-11-18 DIAGNOSIS — E6609 Other obesity due to excess calories: Secondary | ICD-10-CM | POA: Diagnosis not present

## 2023-11-18 DIAGNOSIS — M7121 Synovial cyst of popliteal space [Baker], right knee: Secondary | ICD-10-CM | POA: Diagnosis not present

## 2023-11-18 DIAGNOSIS — I10 Essential (primary) hypertension: Secondary | ICD-10-CM | POA: Diagnosis not present

## 2023-11-18 DIAGNOSIS — Z6835 Body mass index (BMI) 35.0-35.9, adult: Secondary | ICD-10-CM | POA: Diagnosis not present

## 2023-11-18 DIAGNOSIS — R7303 Prediabetes: Secondary | ICD-10-CM | POA: Diagnosis not present

## 2024-01-03 DIAGNOSIS — M1711 Unilateral primary osteoarthritis, right knee: Secondary | ICD-10-CM | POA: Diagnosis not present

## 2024-01-28 DIAGNOSIS — E782 Mixed hyperlipidemia: Secondary | ICD-10-CM | POA: Diagnosis not present

## 2024-01-28 DIAGNOSIS — I10 Essential (primary) hypertension: Secondary | ICD-10-CM | POA: Diagnosis not present

## 2024-01-28 DIAGNOSIS — Z1331 Encounter for screening for depression: Secondary | ICD-10-CM | POA: Diagnosis not present

## 2024-01-28 DIAGNOSIS — Z6834 Body mass index (BMI) 34.0-34.9, adult: Secondary | ICD-10-CM | POA: Diagnosis not present

## 2024-01-28 DIAGNOSIS — R7303 Prediabetes: Secondary | ICD-10-CM | POA: Diagnosis not present

## 2024-01-28 DIAGNOSIS — Z0001 Encounter for general adult medical examination with abnormal findings: Secondary | ICD-10-CM | POA: Diagnosis not present

## 2024-01-28 DIAGNOSIS — E6609 Other obesity due to excess calories: Secondary | ICD-10-CM | POA: Diagnosis not present

## 2024-03-06 DIAGNOSIS — L821 Other seborrheic keratosis: Secondary | ICD-10-CM | POA: Diagnosis not present

## 2024-03-06 DIAGNOSIS — X32XXXD Exposure to sunlight, subsequent encounter: Secondary | ICD-10-CM | POA: Diagnosis not present

## 2024-03-06 DIAGNOSIS — L570 Actinic keratosis: Secondary | ICD-10-CM | POA: Diagnosis not present

## 2024-04-24 DIAGNOSIS — E785 Hyperlipidemia, unspecified: Secondary | ICD-10-CM | POA: Diagnosis not present

## 2024-04-24 DIAGNOSIS — I10 Essential (primary) hypertension: Secondary | ICD-10-CM | POA: Diagnosis not present

## 2024-04-24 DIAGNOSIS — R7303 Prediabetes: Secondary | ICD-10-CM | POA: Diagnosis not present

## 2024-04-24 DIAGNOSIS — Z23 Encounter for immunization: Secondary | ICD-10-CM | POA: Diagnosis not present
